# Patient Record
Sex: Female | Born: 1955 | Race: White | Hispanic: No | Marital: Married | Smoking: Never smoker
Health system: Southern US, Community
[De-identification: ages and names within clinical notes are randomized; demographics above are authoritative.]

## PROBLEM LIST (undated history)

## (undated) DIAGNOSIS — M1990 Unspecified osteoarthritis, unspecified site: Secondary | ICD-10-CM

## (undated) DIAGNOSIS — D8989 Other specified disorders involving the immune mechanism, not elsewhere classified: Secondary | ICD-10-CM

## (undated) DIAGNOSIS — M069 Rheumatoid arthritis, unspecified: Secondary | ICD-10-CM

## (undated) DIAGNOSIS — M351 Other overlap syndromes: Secondary | ICD-10-CM

## (undated) DIAGNOSIS — M810 Age-related osteoporosis without current pathological fracture: Secondary | ICD-10-CM

## (undated) DIAGNOSIS — M8580 Other specified disorders of bone density and structure, unspecified site: Secondary | ICD-10-CM

## (undated) HISTORY — DX: Rheumatoid arthritis, unspecified: M06.9

## (undated) HISTORY — DX: Other specified disorders involving the immune mechanism, not elsewhere classified: D89.89

## (undated) HISTORY — DX: Unspecified osteoarthritis, unspecified site: M19.90

## (undated) HISTORY — DX: Other specified disorders of bone density and structure, unspecified site: M85.80

## (undated) HISTORY — DX: Age-related osteoporosis without current pathological fracture: M81.0

## (undated) HISTORY — DX: Other overlap syndromes: M35.1

## (undated) HISTORY — PX: TUBAL LIGATION: SHX77

---

## 1997-04-19 ENCOUNTER — Other Ambulatory Visit: Admission: RE | Admit: 1997-04-19 | Discharge: 1997-04-19 | Payer: Self-pay | Admitting: *Deleted

## 1998-04-13 ENCOUNTER — Other Ambulatory Visit: Admission: RE | Admit: 1998-04-13 | Discharge: 1998-04-13 | Payer: Self-pay | Admitting: *Deleted

## 1999-05-27 ENCOUNTER — Other Ambulatory Visit: Admission: RE | Admit: 1999-05-27 | Discharge: 1999-05-27 | Payer: Self-pay | Admitting: *Deleted

## 1999-09-10 ENCOUNTER — Encounter: Admission: RE | Admit: 1999-09-10 | Discharge: 1999-09-10 | Payer: Self-pay | Admitting: Family Medicine

## 1999-09-10 ENCOUNTER — Encounter: Payer: Self-pay | Admitting: Family Medicine

## 2000-05-27 ENCOUNTER — Other Ambulatory Visit: Admission: RE | Admit: 2000-05-27 | Discharge: 2000-05-27 | Payer: Self-pay | Admitting: *Deleted

## 2001-04-29 ENCOUNTER — Other Ambulatory Visit: Admission: RE | Admit: 2001-04-29 | Discharge: 2001-04-29 | Payer: Self-pay | Admitting: *Deleted

## 2002-06-03 ENCOUNTER — Encounter: Payer: Self-pay | Admitting: Obstetrics and Gynecology

## 2002-06-03 ENCOUNTER — Ambulatory Visit (HOSPITAL_COMMUNITY): Admission: RE | Admit: 2002-06-03 | Discharge: 2002-06-03 | Payer: Self-pay | Admitting: Obstetrics and Gynecology

## 2002-06-03 ENCOUNTER — Other Ambulatory Visit: Admission: RE | Admit: 2002-06-03 | Discharge: 2002-06-03 | Payer: Self-pay | Admitting: Obstetrics and Gynecology

## 2003-06-27 ENCOUNTER — Ambulatory Visit (HOSPITAL_COMMUNITY): Admission: RE | Admit: 2003-06-27 | Discharge: 2003-06-27 | Payer: Self-pay | Admitting: Obstetrics and Gynecology

## 2003-06-27 ENCOUNTER — Other Ambulatory Visit: Admission: RE | Admit: 2003-06-27 | Discharge: 2003-06-27 | Payer: Self-pay | Admitting: Obstetrics and Gynecology

## 2004-06-28 ENCOUNTER — Ambulatory Visit (HOSPITAL_COMMUNITY): Admission: RE | Admit: 2004-06-28 | Discharge: 2004-06-28 | Payer: Self-pay | Admitting: Obstetrics and Gynecology

## 2004-06-28 ENCOUNTER — Other Ambulatory Visit: Admission: RE | Admit: 2004-06-28 | Discharge: 2004-06-28 | Payer: Self-pay | Admitting: Obstetrics and Gynecology

## 2004-11-18 ENCOUNTER — Ambulatory Visit: Payer: Self-pay | Admitting: Internal Medicine

## 2004-11-20 ENCOUNTER — Encounter: Admission: RE | Admit: 2004-11-20 | Discharge: 2004-11-20 | Payer: Self-pay | Admitting: Internal Medicine

## 2004-12-26 ENCOUNTER — Ambulatory Visit: Payer: Self-pay | Admitting: Internal Medicine

## 2005-01-10 ENCOUNTER — Ambulatory Visit: Payer: Self-pay | Admitting: Internal Medicine

## 2005-02-25 ENCOUNTER — Ambulatory Visit: Payer: Self-pay | Admitting: Internal Medicine

## 2005-03-04 ENCOUNTER — Ambulatory Visit: Payer: Self-pay | Admitting: Internal Medicine

## 2005-03-10 ENCOUNTER — Ambulatory Visit: Payer: Self-pay | Admitting: Gastroenterology

## 2005-04-17 ENCOUNTER — Ambulatory Visit: Payer: Self-pay | Admitting: Internal Medicine

## 2005-04-24 ENCOUNTER — Ambulatory Visit: Payer: Self-pay | Admitting: Cardiology

## 2005-07-01 ENCOUNTER — Ambulatory Visit (HOSPITAL_COMMUNITY): Admission: RE | Admit: 2005-07-01 | Discharge: 2005-07-01 | Payer: Self-pay | Admitting: Obstetrics and Gynecology

## 2005-07-17 ENCOUNTER — Encounter: Admission: RE | Admit: 2005-07-17 | Discharge: 2005-07-17 | Payer: Self-pay | Admitting: Obstetrics and Gynecology

## 2005-07-21 ENCOUNTER — Other Ambulatory Visit: Admission: RE | Admit: 2005-07-21 | Discharge: 2005-07-21 | Payer: Self-pay | Admitting: Obstetrics and Gynecology

## 2005-07-22 ENCOUNTER — Ambulatory Visit: Payer: Self-pay | Admitting: Internal Medicine

## 2006-07-03 ENCOUNTER — Ambulatory Visit (HOSPITAL_COMMUNITY): Admission: RE | Admit: 2006-07-03 | Discharge: 2006-07-03 | Payer: Self-pay | Admitting: Obstetrics and Gynecology

## 2006-11-13 ENCOUNTER — Other Ambulatory Visit: Admission: RE | Admit: 2006-11-13 | Discharge: 2006-11-13 | Payer: Self-pay | Admitting: Obstetrics and Gynecology

## 2007-10-07 ENCOUNTER — Encounter: Payer: Self-pay | Admitting: Internal Medicine

## 2007-11-26 ENCOUNTER — Ambulatory Visit (HOSPITAL_COMMUNITY): Admission: RE | Admit: 2007-11-26 | Discharge: 2007-11-26 | Payer: Self-pay | Admitting: Obstetrics and Gynecology

## 2007-11-29 ENCOUNTER — Other Ambulatory Visit: Admission: RE | Admit: 2007-11-29 | Discharge: 2007-11-29 | Payer: Self-pay | Admitting: Obstetrics and Gynecology

## 2008-02-22 ENCOUNTER — Encounter: Payer: Self-pay | Admitting: Internal Medicine

## 2008-12-22 ENCOUNTER — Ambulatory Visit (HOSPITAL_COMMUNITY): Admission: RE | Admit: 2008-12-22 | Discharge: 2008-12-22 | Payer: Self-pay | Admitting: Obstetrics and Gynecology

## 2009-06-21 ENCOUNTER — Ambulatory Visit (HOSPITAL_COMMUNITY): Admission: RE | Admit: 2009-06-21 | Discharge: 2009-06-21 | Payer: Self-pay | Admitting: Obstetrics and Gynecology

## 2010-01-27 ENCOUNTER — Encounter: Payer: Self-pay | Admitting: Obstetrics and Gynecology

## 2010-02-15 ENCOUNTER — Other Ambulatory Visit: Payer: Self-pay | Admitting: Obstetrics and Gynecology

## 2010-02-15 DIAGNOSIS — Z1231 Encounter for screening mammogram for malignant neoplasm of breast: Secondary | ICD-10-CM

## 2010-03-11 ENCOUNTER — Ambulatory Visit (HOSPITAL_COMMUNITY)
Admission: RE | Admit: 2010-03-11 | Discharge: 2010-03-11 | Disposition: A | Discharge status: Home / Self Care | Payer: BC Managed Care – PPO | Source: Ambulatory Visit | Attending: Obstetrics and Gynecology | Admitting: Obstetrics and Gynecology

## 2010-03-11 DIAGNOSIS — Z1231 Encounter for screening mammogram for malignant neoplasm of breast: Secondary | ICD-10-CM | POA: Insufficient documentation

## 2011-01-06 DIAGNOSIS — Z79899 Other long term (current) drug therapy: Secondary | ICD-10-CM | POA: Insufficient documentation

## 2011-01-06 DIAGNOSIS — M359 Systemic involvement of connective tissue, unspecified: Secondary | ICD-10-CM | POA: Insufficient documentation

## 2011-02-04 ENCOUNTER — Other Ambulatory Visit: Payer: Self-pay | Admitting: Obstetrics and Gynecology

## 2011-02-04 DIAGNOSIS — Z1231 Encounter for screening mammogram for malignant neoplasm of breast: Secondary | ICD-10-CM

## 2011-03-12 ENCOUNTER — Ambulatory Visit (HOSPITAL_COMMUNITY)
Admission: RE | Admit: 2011-03-12 | Discharge: 2011-03-12 | Disposition: A | Discharge status: Home / Self Care | Payer: BC Managed Care – PPO | Source: Ambulatory Visit | Attending: Obstetrics and Gynecology | Admitting: Obstetrics and Gynecology

## 2011-03-12 DIAGNOSIS — Z1231 Encounter for screening mammogram for malignant neoplasm of breast: Secondary | ICD-10-CM | POA: Insufficient documentation

## 2011-04-30 ENCOUNTER — Other Ambulatory Visit (HOSPITAL_COMMUNITY): Payer: Self-pay | Admitting: Internal Medicine

## 2011-04-30 DIAGNOSIS — M8580 Other specified disorders of bone density and structure, unspecified site: Secondary | ICD-10-CM

## 2011-07-14 ENCOUNTER — Ambulatory Visit (HOSPITAL_COMMUNITY): Payer: BC Managed Care – PPO

## 2011-07-17 ENCOUNTER — Ambulatory Visit (HOSPITAL_COMMUNITY)
Admission: RE | Admit: 2011-07-17 | Discharge: 2011-07-17 | Disposition: A | Discharge status: Home / Self Care | Payer: BC Managed Care – PPO | Source: Ambulatory Visit | Attending: Internal Medicine | Admitting: Internal Medicine

## 2011-07-17 DIAGNOSIS — Z780 Asymptomatic menopausal state: Secondary | ICD-10-CM | POA: Insufficient documentation

## 2011-07-17 DIAGNOSIS — M8580 Other specified disorders of bone density and structure, unspecified site: Secondary | ICD-10-CM

## 2011-07-17 DIAGNOSIS — Z13820 Encounter for screening for osteoporosis: Secondary | ICD-10-CM | POA: Insufficient documentation

## 2012-01-08 DIAGNOSIS — M159 Polyosteoarthritis, unspecified: Secondary | ICD-10-CM | POA: Insufficient documentation

## 2012-01-20 ENCOUNTER — Encounter: Payer: Self-pay | Admitting: Internal Medicine

## 2012-04-09 ENCOUNTER — Other Ambulatory Visit: Payer: Self-pay | Admitting: Obstetrics and Gynecology

## 2012-04-09 DIAGNOSIS — Z1231 Encounter for screening mammogram for malignant neoplasm of breast: Secondary | ICD-10-CM

## 2012-04-15 ENCOUNTER — Ambulatory Visit (HOSPITAL_COMMUNITY)
Admission: RE | Admit: 2012-04-15 | Discharge: 2012-04-15 | Disposition: A | Discharge status: Home / Self Care | Payer: BC Managed Care – PPO | Source: Ambulatory Visit | Attending: Obstetrics and Gynecology | Admitting: Obstetrics and Gynecology

## 2012-04-15 DIAGNOSIS — Z1231 Encounter for screening mammogram for malignant neoplasm of breast: Secondary | ICD-10-CM | POA: Insufficient documentation

## 2012-04-20 ENCOUNTER — Other Ambulatory Visit: Payer: Self-pay | Admitting: Obstetrics and Gynecology

## 2012-04-20 DIAGNOSIS — R928 Other abnormal and inconclusive findings on diagnostic imaging of breast: Secondary | ICD-10-CM

## 2012-05-03 ENCOUNTER — Ambulatory Visit
Admission: RE | Admit: 2012-05-03 | Discharge: 2012-05-03 | Disposition: A | Discharge status: Home / Self Care | Payer: BC Managed Care – PPO | Source: Ambulatory Visit | Attending: Obstetrics and Gynecology | Admitting: Obstetrics and Gynecology

## 2012-05-03 DIAGNOSIS — R928 Other abnormal and inconclusive findings on diagnostic imaging of breast: Secondary | ICD-10-CM

## 2012-09-01 ENCOUNTER — Encounter: Payer: Self-pay | Admitting: Internal Medicine

## 2012-09-28 ENCOUNTER — Other Ambulatory Visit: Payer: Self-pay | Admitting: Gynecology

## 2012-09-28 ENCOUNTER — Other Ambulatory Visit: Payer: Self-pay | Admitting: Certified Nurse Midwife

## 2012-09-28 DIAGNOSIS — N63 Unspecified lump in unspecified breast: Secondary | ICD-10-CM

## 2012-11-03 ENCOUNTER — Ambulatory Visit
Admission: RE | Admit: 2012-11-03 | Discharge: 2012-11-03 | Disposition: A | Discharge status: Home / Self Care | Payer: BC Managed Care – PPO | Source: Ambulatory Visit | Attending: Certified Nurse Midwife | Admitting: Certified Nurse Midwife

## 2012-11-03 ENCOUNTER — Other Ambulatory Visit: Payer: Self-pay | Admitting: Certified Nurse Midwife

## 2012-11-03 DIAGNOSIS — N63 Unspecified lump in unspecified breast: Secondary | ICD-10-CM

## 2012-12-09 ENCOUNTER — Telehealth: Payer: Self-pay | Admitting: *Deleted

## 2012-12-09 NOTE — Telephone Encounter (Signed)
Message copied by Alisa Graff on Thu Dec 09, 2012  9:44 AM ------      Message from: Jerene Bears      Created: Tue Nov 23, 2012 10:42 PM      Regarding: MMG recall       Pt should come out of current recall and needs new one 4/15.  Thanks.                  ----- Message -----         From: Verner Chol, CNM         Sent: 11/14/2012   5:32 PM           To: Annamaria Boots, MD            Determine status if OK for recall or need earlier follow up      Debbi       ------

## 2012-12-09 NOTE — Telephone Encounter (Signed)
Recall in entered for 04-20-13.

## 2013-01-20 ENCOUNTER — Encounter: Payer: Self-pay | Admitting: Certified Nurse Midwife

## 2013-02-16 ENCOUNTER — Encounter: Payer: Self-pay | Admitting: Certified Nurse Midwife

## 2013-03-01 ENCOUNTER — Ambulatory Visit: Payer: Self-pay | Admitting: Certified Nurse Midwife

## 2013-03-09 ENCOUNTER — Ambulatory Visit: Payer: Self-pay | Admitting: Certified Nurse Midwife

## 2013-03-18 ENCOUNTER — Encounter: Payer: Self-pay | Admitting: Certified Nurse Midwife

## 2013-03-22 ENCOUNTER — Ambulatory Visit: Payer: Self-pay | Admitting: Certified Nurse Midwife

## 2013-03-29 ENCOUNTER — Encounter: Payer: Self-pay | Admitting: Certified Nurse Midwife

## 2013-03-29 ENCOUNTER — Ambulatory Visit (INDEPENDENT_AMBULATORY_CARE_PROVIDER_SITE_OTHER): Payer: BC Managed Care – PPO | Admitting: Certified Nurse Midwife

## 2013-03-29 VITALS — BP 115/60 | HR 72 | Resp 16 | Ht 63.5 in | Wt 122.0 lb

## 2013-03-29 DIAGNOSIS — Z01419 Encounter for gynecological examination (general) (routine) without abnormal findings: Secondary | ICD-10-CM

## 2013-03-29 DIAGNOSIS — Z1211 Encounter for screening for malignant neoplasm of colon: Secondary | ICD-10-CM

## 2013-03-29 DIAGNOSIS — M8580 Other specified disorders of bone density and structure, unspecified site: Secondary | ICD-10-CM | POA: Insufficient documentation

## 2013-03-29 NOTE — Progress Notes (Signed)
58 y.o. G8P3003 Married Caucasian Fe here for annual exam. Menopausal no HRT, denies any vaginal bleeding. Using Replens OTC for vaginal dryness, working well. Continues with treatment for connective tissue disease, which is progressing. Sees Rheumatology for management. Sees PCP for aex, labs, and other medication management. No new health problems today.  No LMP recorded. Patient is postmenopausal.          Sexually active: yes  The current method of family planning is tubal ligation.    Exercising: yes  Walking Smoker:  no  Health Maintenance: Pap:  02/27/2012 Normal  MMG:  11/ 2014 and 04/2012 under follow up for left breast, scheduled 4/15 for mammogram and [place] Colonoscopy:  2007 - Every 10 years BMD:   07/2011 - In epic TDaP:  Will consult with PCP Labs: PCP   reports that she has never smoked. She has never used smokeless tobacco. She reports that she does not drink alcohol or use illicit drugs.  Past Medical History  Diagnosis Date  . Osteoporosis   . Autoimmune disorder     pleurisy  . Mixed connective tissue disease   . Osteoarthritis   . Osteopenia     Past Surgical History  Procedure Laterality Date  . Tubal ligation      1983    Current Outpatient Prescriptions  Medication Sig Dispense Refill  . aspirin 81 MG tablet Take 81 mg by mouth daily.      . Calcium Carbonate-Vitamin D 600-400 MG-UNIT per tablet Take 1 capsule by mouth.      . Cholecalciferol (VITAMIN D PO) Take 2,000 Int'l Units by mouth daily.       . folic acid (FOLVITE) 1 MG tablet Take 1 mg by mouth daily.       . hydroxychloroquine (PLAQUENIL) 200 MG tablet Take by mouth daily.      . methotrexate (RHEUMATREX) 2.5 MG tablet Take 5 tabs (12.5 mg total) BY MOUTH ONCE A WEEK. ALL TABS IN A SINGLE DOSE      . naproxen (NAPROSYN) 500 MG tablet Take 500 mg by mouth 2 (two) times daily with a meal.      . Probiotic Product (ALIGN) 4 MG CAPS Take by mouth.      . traMADol (ULTRAM) 50 MG tablet Take 100 mg  by mouth every 6 (six) hours as needed.      Jeanie Cooks Serrata (BOSWELLIA PO) Take by mouth.      Marland Kitchen CALCIUM PO Take by mouth daily.      . Cyanocobalamin (VITAMIN B 12 PO) Take by mouth daily.      Marland Kitchen HYDROcodone-acetaminophen (NORCO/VICODIN) 5-325 MG per tablet Take 1 tablet by mouth.      . Omega-3 Fatty Acids (FISH OIL PO) Take by mouth daily.       No current facility-administered medications for this visit.    Family History  Problem Relation Age of Onset  . Osteoporosis Mother   . Deep vein thrombosis Mother     ROS:  Pertinent items are noted in HPI.  Otherwise, a comprehensive ROS was negative.  Exam:   BP 115/60  Pulse 72  Resp 16  Ht 5' 3.5" (1.613 m)  Wt 122 lb (55.339 kg)  BMI 21.27 kg/m2 Height: 5' 3.5" (161.3 cm)  Ht Readings from Last 3 Encounters:  03/29/13 5' 3.5" (1.613 m)    General appearance: alert, cooperative and appears stated age Head: Normocephalic, without obvious abnormality, atraumatic Neck: no adenopathy, supple, symmetrical, trachea midline  and thyroid normal to inspection and palpation and non-palpable Lungs: clear to auscultation bilaterally Breasts: normal appearance, no masses or tenderness, No nipple retraction or dimpling, No nipple discharge or bleeding, No axillary or supraclavicular adenopathy, no palpable mass noted in area of follow up for left breast Heart: regular rate and rhythm Abdomen: soft, non-tender; no masses,  no organomegaly Extremities: extremities normal, atraumatic, no cyanosis or edema Skin: Skin color, texture, turgor normal. No rashes or lesions Lymph nodes: Cervical, supraclavicular, and axillary nodes normal. No abnormal inguinal nodes palpated Neurologic: Grossly normal   Pelvic: External genitalia:  no lesions              Urethra:  normal appearing urethra with no masses, tenderness or lesions              Bartholin's and Skene's: normal                 Vagina: normal appearing vagina with normal color and  discharge, no lesions              Cervix: normal, non tender              Pap taken: no Bimanual Exam:  Uterus:  normal size, contour, position, consistency, mobility, non-tender and anteverted              Adnexa: normal adnexa and no mass, fullness, tenderness               Rectovaginal: Confirms               Anus:  normal sphincter tone, no lesions  A:  Well Woman with normal exam  Menopausal no HRT  Connective tissue disease under Rheumatology management  Left breast mass under follow up, appeared benign findings  P:   Reviewed health and wellness pertinent to exam  Aware of importance to advise if vaginal bleeding  Continue follow up as indicated  Pap smear as per guidelines   Mammogram per follow up, schedule 4/15 pap smear not taken today  counseled on breast self exam, mammography screening, adequate intake of calcium and vitamin D, diet and exercise Colonoscopy in 2 years, IFOB dispensed with instructions.  return annually or prn  An After Visit Summary was printed and given to the patient.

## 2013-03-29 NOTE — Patient Instructions (Signed)

## 2013-03-31 ENCOUNTER — Other Ambulatory Visit: Payer: Self-pay | Admitting: Certified Nurse Midwife

## 2013-03-31 DIAGNOSIS — N63 Unspecified lump in unspecified breast: Secondary | ICD-10-CM

## 2013-03-31 NOTE — Progress Notes (Signed)
Reviewed personally.  M. Suzanne Keiley Levey, MD.  

## 2013-05-04 ENCOUNTER — Ambulatory Visit: Payer: BC Managed Care – PPO

## 2013-05-04 ENCOUNTER — Other Ambulatory Visit: Payer: BC Managed Care – PPO

## 2013-05-17 ENCOUNTER — Ambulatory Visit
Admission: RE | Admit: 2013-05-17 | Discharge: 2013-05-17 | Disposition: A | Discharge status: Home / Self Care | Payer: BC Managed Care – PPO | Source: Ambulatory Visit | Attending: Certified Nurse Midwife | Admitting: Certified Nurse Midwife

## 2013-05-17 DIAGNOSIS — N63 Unspecified lump in unspecified breast: Secondary | ICD-10-CM

## 2013-08-15 ENCOUNTER — Telehealth: Payer: Self-pay

## 2013-08-15 NOTE — Telephone Encounter (Signed)
Message copied by Eliezer Bottom on Mon Aug 15, 2013  2:22 PM ------      Message from: Verner Chol      Created: Sat Aug 13, 2013  4:37 PM       Has not completed IFOB please call ------

## 2013-08-15 NOTE — Telephone Encounter (Signed)
Called & left detailed message. DPR signed

## 2013-08-29 DIAGNOSIS — F329 Major depressive disorder, single episode, unspecified: Secondary | ICD-10-CM | POA: Insufficient documentation

## 2013-08-29 DIAGNOSIS — F32A Depression, unspecified: Secondary | ICD-10-CM | POA: Insufficient documentation

## 2013-08-29 DIAGNOSIS — E673 Hypervitaminosis D: Secondary | ICD-10-CM | POA: Insufficient documentation

## 2013-08-31 LAB — FECAL OCCULT BLOOD, IMMUNOCHEMICAL: IFOBT: NEGATIVE

## 2013-11-07 ENCOUNTER — Encounter: Payer: Self-pay | Admitting: Certified Nurse Midwife

## 2014-04-05 ENCOUNTER — Ambulatory Visit: Payer: Self-pay | Admitting: Certified Nurse Midwife

## 2014-04-10 ENCOUNTER — Other Ambulatory Visit: Payer: Self-pay | Admitting: Certified Nurse Midwife

## 2014-04-10 ENCOUNTER — Other Ambulatory Visit: Payer: Self-pay | Admitting: Internal Medicine

## 2014-04-10 DIAGNOSIS — N63 Unspecified lump in unspecified breast: Secondary | ICD-10-CM

## 2014-04-13 ENCOUNTER — Ambulatory Visit: Payer: Self-pay | Admitting: Certified Nurse Midwife

## 2014-05-22 ENCOUNTER — Ambulatory Visit
Admission: RE | Admit: 2014-05-22 | Discharge: 2014-05-22 | Disposition: A | Discharge status: Home / Self Care | Payer: BLUE CROSS/BLUE SHIELD | Source: Ambulatory Visit | Attending: Certified Nurse Midwife | Admitting: Certified Nurse Midwife

## 2014-05-22 DIAGNOSIS — N63 Unspecified lump in unspecified breast: Secondary | ICD-10-CM

## 2014-08-01 ENCOUNTER — Encounter: Payer: Self-pay | Admitting: Certified Nurse Midwife

## 2014-08-01 ENCOUNTER — Ambulatory Visit (INDEPENDENT_AMBULATORY_CARE_PROVIDER_SITE_OTHER): Payer: BLUE CROSS/BLUE SHIELD | Admitting: Certified Nurse Midwife

## 2014-08-01 VITALS — BP 94/60 | HR 70 | Resp 16 | Ht 63.25 in | Wt 116.0 lb

## 2014-08-01 DIAGNOSIS — Z01419 Encounter for gynecological examination (general) (routine) without abnormal findings: Secondary | ICD-10-CM

## 2014-08-01 DIAGNOSIS — Z124 Encounter for screening for malignant neoplasm of cervix: Secondary | ICD-10-CM | POA: Diagnosis not present

## 2014-08-01 NOTE — Patient Instructions (Addendum)
Atrophic Vaginitis Atrophic vaginitis is a problem of low levels of estrogen in women. This problem can happen at any age. It is most common in women who have gone through menopause ("the change").  HOW WILL I KNOW IF I HAVE THIS PROBLEM? You may have:  Trouble with peeing (urinating), such as:  Going to the bathroom often.  A hard time holding your pee until you reach a bathroom.  Leaking pee.  Having pain when you pee.  Itching or a burning feeling.  Vaginal bleeding and spotting.  Pain during sex.  Dryness of the vagina.  A yellow, bad-smelling fluid (discharge) coming from the vagina. HOW WILL MY DOCTOR CHECK FOR THIS PROBLEM?  During your exam, your doctor will likely find the problem.  If there is a vaginal fluid, it may be checked for infection. HOW WILL THIS PROBLEM BE TREATED? Keep the vulvar skin as clean as possible. Moisturizers and lubricants can help with some of the symptoms. Estrogen replacement can help. There are 2 ways to take estrogen:  Systemic estrogen gets estrogen to your whole body. It takes many weeks or months before the symptoms get better.  You take an estrogen pill.  You use a skin patch. This is a patch that you put on your skin.  If you still have your uterus, your doctor may ask you to take a hormone. Talk to your doctor about the right medicine for you.  Estrogen cream.  This puts estrogen only at the part of your body where you apply it. The cream is put into the vagina or put on the vulvar skin. For some women, estrogen cream works faster than pills or the patch. CAN ALL WOMEN WITH THIS PROBLEM USE ESTROGEN? No. Women with certain types of cancer, liver problems, or problems with blood clots should not take estrogen. Your doctor can help you decide the best treatment for your symptoms. Document Released: 06/11/2007 Document Revised: 12/28/2012 Document Reviewed: 06/11/2007 [employer] Patient Information 2015 [employer]. This  information is not intended to replace advice given to you by your health care provider. Make sure you discuss any questions you have with your health care provider.  EXERCISE AND DIET:  We recommended that you start or continue a regular exercise program for good health. Regular exercise means any activity that makes your heart beat faster and makes you sweat.  We recommend exercising at least 30 minutes per day at least 3 days a week, preferably 4 or 5.  We also recommend a diet low in fat and sugar.  Inactivity, poor dietary choices and obesity can cause diabetes, heart attack, stroke, and kidney damage, among others.    ALCOHOL AND SMOKING:  Women should limit their alcohol intake to no more than 7 drinks/beers/glasses of wine (combined, not each!) per week. Moderation of alcohol intake to this level decreases your risk of breast cancer and liver damage. And of course, no recreational drugs are part of a healthy lifestyle.  And absolutely no smoking or even second hand smoke. Most people know smoking can cause heart and lung diseases, but did you know it also contributes to weakening of your bones? Aging of your skin?  Yellowing of your teeth and nails?  CALCIUM AND VITAMIN D:  Adequate intake of calcium and Vitamin D are recommended.  The recommendations for exact amounts of these supplements seem to change often, but generally speaking 600 mg of calcium (either carbonate or citrate) and 800 units of Vitamin D per day seems   prudent. Certain women may benefit from higher intake of Vitamin D.  If you are among these women, your doctor will have told you during your visit.    PAP SMEARS:  Pap smears, to check for cervical cancer or precancers,  have traditionally been done yearly, although recent scientific advances have shown that most women can have pap smears less often.  However, every woman still should have a physical exam from her gynecologist every year. It will include a breast check, inspection  of the vulva and vagina to check for abnormal growths or skin changes, a visual exam of the cervix, and then an exam to evaluate the size and shape of the uterus and ovaries.  And after 61 years of age, a rectal exam is indicated to check for rectal cancers. We will also provide age appropriate advice regarding health maintenance, like when you should have certain vaccines, screening for sexually transmitted diseases, bone density testing, colonoscopy, mammograms, etc.   MAMMOGRAMS:  All women over 40 years old should have a yearly mammogram. Many facilities now offer a "3D" mammogram, which may cost around $50 extra out of pocket. If possible,  we recommend you accept the option to have the 3D mammogram performed.  It both reduces the number of women who will be called back for extra views which then turn out to be normal, and it is better than the routine mammogram at detecting truly abnormal areas.    COLONOSCOPY:  Colonoscopy to screen for colon cancer is recommended for all women at age 50.  We know, you hate the idea of the prep.  We agree, BUT, having colon cancer and not knowing it is worse!!  Colon cancer so often starts as a polyp that can be seen and removed at colonscopy, which can quite literally save your life!  And if your first colonoscopy is normal and you have no family history of colon cancer, most women don't have to have it again for 10 years.  Once every ten years, you can do something that may end up saving your life, right?  We will be happy to help you get it scheduled when you are ready.  Be sure to check your insurance coverage so you understand how much it will cost.  It may be covered as a preventative service at no cost, but you should check your particular policy.       

## 2014-08-01 NOTE — Progress Notes (Signed)
59 y.o. G32P3003 Married  Caucasian Fe here for annual exam. Menopausal no vaginal bleeding. Using Replens for vaginal dryness, but not working well. Sees Dr. Sherlyn Lick with aex/labs. Last BMD showed Osteoporosis now on Forteo with specialist in [place] Connective tissue disease no change. Sees Rheumatology every 6 months. Continues to have constipation but plans to start on probiotics to help with and discuss with MD. No other health issues today.  No LMP recorded. Patient is postmenopausal.          Sexually active: Yes.    The current method of family planning is tubal ligation.    Exercising: Yes.    walking Smoker:  no  Health Maintenance: Pap:  02-27-12 neg MMG: 05-22-14 u/s left breast density c birads 2 3 D yearly Colonoscopy: 2007 f/u 37yrs  BMD: 4/16  Osteoporosis sees Dr.Lake (osteoporosis specialist) now Forteo TDaP:  Will check with pcp Labs: none Self breast exam: done monthly   reports that she has never smoked. She has never used smokeless tobacco. She reports that she does not drink alcohol or use illicit drugs.  Past Medical History  Diagnosis Date  . Osteoporosis   . Autoimmune disorder     pleurisy  . Mixed connective tissue disease   . Osteoarthritis   . Osteopenia   . Osteoporosis     Past Surgical History  Procedure Laterality Date  . Tubal ligation      1983    Current Outpatient Prescriptions  Medication Sig Dispense Refill  . aspirin 81 MG tablet Take 81 mg by mouth daily.    . Calcium Citrate (CITRACAL PO) Take by mouth.    . Cholecalciferol (VITAMIN D PO) Take 2,000 Int'l Units by mouth daily.     . Cyanocobalamin (VITAMIN B 12 PO) Take by mouth daily.    Marland Kitchen escitalopram (LEXAPRO) 10 MG tablet Take 10 mg by mouth daily.    . folic acid (FOLVITE) 1 MG tablet Take 1 mg by mouth daily.     . hydroxychloroquine (PLAQUENIL) 200 MG tablet Take by mouth daily.    Marland Kitchen LORazepam (ATIVAN) 1 MG tablet Take 1 mg by mouth.    . methotrexate (RHEUMATREX) 2.5 MG tablet  Take 5 tabs (12.5 mg total) BY MOUTH ONCE A WEEK. ALL TABS IN A SINGLE DOSE    . naproxen (NAPROSYN) 500 MG tablet Take 500 mg by mouth 2 (two) times daily with a meal.    . Teriparatide, Recombinant, (FORTEO) 600 MCG/2.4ML SOLN Inject 20 mcg into the skin.    Marland Kitchen traMADol (ULTRAM) 50 MG tablet Take 100 mg by mouth every 6 (six) hours as needed.    Marland Kitchen UNABLE TO FIND 300 mg. Avocado po Takes 2 daily     No current facility-administered medications for this visit.    Family History  Problem Relation Age of Onset  . Osteoporosis Mother   . Deep vein thrombosis Mother     ROS:  Pertinent items are noted in HPI.  Otherwise, a comprehensive ROS was negative.  Exam:   BP 94/60 mmHg  Pulse 70  Resp 16  Ht 5' 3.25" (1.607 m)  Wt 116 lb (52.617 kg)  BMI 20.37 kg/m2 Height: 5' 3.25" (160.7 cm) Ht Readings from Last 3 Encounters:  08/01/14 5' 3.25" (1.607 m)  03/29/13 5' 3.5" (1.613 m)    General appearance: alert, cooperative and appears stated age Head: Normocephalic, without obvious abnormality, atraumatic Neck: no adenopathy, supple, symmetrical, trachea midline and thyroid normal to inspection  and palpation Lungs: clear to auscultation bilaterally Breasts: normal appearance, no masses or tenderness, No nipple retraction or dimpling, No nipple discharge or bleeding, No axillary or supraclavicular adenopathy Heart: regular rate and rhythm Abdomen: soft, non-tender; no masses,  no organomegaly Extremities: extremities normal, atraumatic, no cyanosis or edema Skin: Skin color, texture, turgor normal. No rashes or lesions Lymph nodes: Cervical, supraclavicular, and axillary nodes normal. No abnormal inguinal nodes palpated Neurologic: Grossly normal   Pelvic: External genitalia:  no lesions              Urethra:  normal appearing urethra with no masses, tenderness or lesions              Bartholin's and Skene's: normal                 Vagina: atrophic appearing vagina with normal  color and discharge, no lesions              Cervix: normal,no lesions              Pap taken: Yes.   Bimanual Exam:  Uterus:  normal size, contour, position, consistency, mobility, non-tender              Adnexa: normal adnexa and no mass, fullness, tenderness               Rectovaginal: Confirms               Anus:  normal sphincter tone, no lesions  Chaperone present: Yes  A:  Well Woman with normal exam  Menopausal no HRT  Atrophic Vaginitis   Connective tissue disease with MD management P:   Reviewed health and wellness pertinent to exam  Aware of need to evaluate if vaginal bleeding  Discussed coconut oil use daily to see if this improves, will advise  Pap smear taken today with HPVHR  Continue MD follow up as indicated   counseled on breast self exam, mammography screening, adequate intake of calcium and vitamin D, diet and exercise  return annually or prn  An After Visit Summary was printed and given to the patient.

## 2014-08-03 LAB — IPS PAP TEST WITH HPV

## 2014-08-03 NOTE — Progress Notes (Signed)
Reviewed personally.  M. Suzanne Becky Berberian, MD.  

## 2014-12-30 DIAGNOSIS — M810 Age-related osteoporosis without current pathological fracture: Secondary | ICD-10-CM | POA: Insufficient documentation

## 2015-05-01 ENCOUNTER — Other Ambulatory Visit: Payer: Self-pay

## 2015-05-01 DIAGNOSIS — Z1231 Encounter for screening mammogram for malignant neoplasm of breast: Secondary | ICD-10-CM

## 2015-05-23 ENCOUNTER — Ambulatory Visit
Admission: RE | Admit: 2015-05-23 | Discharge: 2015-05-23 | Disposition: A | Discharge status: Home / Self Care | Payer: BLUE CROSS/BLUE SHIELD | Source: Ambulatory Visit

## 2015-05-23 DIAGNOSIS — Z1231 Encounter for screening mammogram for malignant neoplasm of breast: Secondary | ICD-10-CM

## 2015-05-30 DIAGNOSIS — F5101 Primary insomnia: Secondary | ICD-10-CM | POA: Insufficient documentation

## 2015-08-03 ENCOUNTER — Ambulatory Visit: Payer: BLUE CROSS/BLUE SHIELD | Admitting: Certified Nurse Midwife

## 2015-11-13 ENCOUNTER — Other Ambulatory Visit: Payer: Self-pay | Admitting: Internal Medicine

## 2015-11-13 DIAGNOSIS — N6310 Unspecified lump in the right breast, unspecified quadrant: Secondary | ICD-10-CM

## 2015-11-16 ENCOUNTER — Ambulatory Visit
Admission: RE | Admit: 2015-11-16 | Discharge: 2015-11-16 | Disposition: A | Discharge status: Home / Self Care | Payer: BLUE CROSS/BLUE SHIELD | Source: Ambulatory Visit | Attending: Internal Medicine | Admitting: Internal Medicine

## 2015-11-16 DIAGNOSIS — N6310 Unspecified lump in the right breast, unspecified quadrant: Secondary | ICD-10-CM

## 2016-07-24 DIAGNOSIS — Z79899 Other long term (current) drug therapy: Secondary | ICD-10-CM | POA: Insufficient documentation

## 2016-07-24 DIAGNOSIS — M1990 Unspecified osteoarthritis, unspecified site: Secondary | ICD-10-CM | POA: Insufficient documentation

## 2016-07-24 DIAGNOSIS — Z7960 Long term (current) use of unspecified immunomodulators and immunosuppressants: Secondary | ICD-10-CM | POA: Insufficient documentation

## 2016-07-24 DIAGNOSIS — M170 Bilateral primary osteoarthritis of knee: Secondary | ICD-10-CM | POA: Insufficient documentation

## 2016-10-15 ENCOUNTER — Other Ambulatory Visit: Payer: Self-pay | Admitting: Internal Medicine

## 2016-10-15 DIAGNOSIS — Z1231 Encounter for screening mammogram for malignant neoplasm of breast: Secondary | ICD-10-CM

## 2016-10-30 ENCOUNTER — Ambulatory Visit
Admission: RE | Admit: 2016-10-30 | Discharge: 2016-10-30 | Disposition: A | Discharge status: Home / Self Care | Payer: BLUE CROSS/BLUE SHIELD | Source: Ambulatory Visit | Attending: Internal Medicine | Admitting: Internal Medicine

## 2016-10-30 DIAGNOSIS — Z1231 Encounter for screening mammogram for malignant neoplasm of breast: Secondary | ICD-10-CM

## 2016-11-14 ENCOUNTER — Emergency Department (HOSPITAL_BASED_OUTPATIENT_CLINIC_OR_DEPARTMENT_OTHER): Payer: BLUE CROSS/BLUE SHIELD

## 2016-11-14 ENCOUNTER — Encounter (HOSPITAL_BASED_OUTPATIENT_CLINIC_OR_DEPARTMENT_OTHER): Payer: Self-pay | Admitting: *Deleted

## 2016-11-14 ENCOUNTER — Observation Stay (HOSPITAL_BASED_OUTPATIENT_CLINIC_OR_DEPARTMENT_OTHER)
Admission: EM | Admit: 2016-11-14 | Discharge: 2016-11-15 | Disposition: A | Discharge status: Home / Self Care | Payer: BLUE CROSS/BLUE SHIELD | Attending: Internal Medicine | Admitting: Internal Medicine

## 2016-11-14 DIAGNOSIS — D8989 Other specified disorders involving the immune mechanism, not elsewhere classified: Secondary | ICD-10-CM | POA: Insufficient documentation

## 2016-11-14 DIAGNOSIS — M069 Rheumatoid arthritis, unspecified: Secondary | ICD-10-CM | POA: Insufficient documentation

## 2016-11-14 DIAGNOSIS — M359 Systemic involvement of connective tissue, unspecified: Secondary | ICD-10-CM | POA: Diagnosis present

## 2016-11-14 DIAGNOSIS — K1120 Sialoadenitis, unspecified: Secondary | ICD-10-CM

## 2016-11-14 DIAGNOSIS — Z79899 Other long term (current) drug therapy: Secondary | ICD-10-CM | POA: Diagnosis not present

## 2016-11-14 DIAGNOSIS — R221 Localized swelling, mass and lump, neck: Secondary | ICD-10-CM

## 2016-11-14 DIAGNOSIS — M351 Other overlap syndromes: Secondary | ICD-10-CM | POA: Diagnosis not present

## 2016-11-14 LAB — CBC WITH DIFFERENTIAL/PLATELET
BASOS ABS: 0 10*3/uL (ref 0.0–0.1)
BASOS PCT: 0 %
EOS ABS: 0 10*3/uL (ref 0.0–0.7)
EOS PCT: 0 %
HCT: 35.1 % — ABNORMAL LOW (ref 36.0–46.0)
Hemoglobin: 11.5 g/dL — ABNORMAL LOW (ref 12.0–15.0)
Lymphocytes Relative: 24 %
Lymphs Abs: 3.6 10*3/uL (ref 0.7–4.0)
MCH: 30.2 pg (ref 26.0–34.0)
MCHC: 32.8 g/dL (ref 30.0–36.0)
MCV: 92.1 fL (ref 78.0–100.0)
MONO ABS: 1.8 10*3/uL — AB (ref 0.1–1.0)
Monocytes Relative: 12 %
Neutro Abs: 9.4 10*3/uL — ABNORMAL HIGH (ref 1.7–7.7)
Neutrophils Relative %: 64 %
Platelets: 350 10*3/uL (ref 150–400)
RBC: 3.81 MIL/uL — ABNORMAL LOW (ref 3.87–5.11)
RDW: 16.3 % — AB (ref 11.5–15.5)
WBC: 14.8 10*3/uL — ABNORMAL HIGH (ref 4.0–10.5)

## 2016-11-14 LAB — BASIC METABOLIC PANEL
Anion gap: 8 (ref 5–15)
BUN: 23 mg/dL — ABNORMAL HIGH (ref 6–20)
CALCIUM: 9.1 mg/dL (ref 8.9–10.3)
CO2: 25 mmol/L (ref 22–32)
CREATININE: 0.79 mg/dL (ref 0.44–1.00)
Chloride: 107 mmol/L (ref 101–111)
GFR calc Af Amer: >60 mL/min (ref >60)
GFR calc non Af Amer: >60 mL/min (ref >60)
GLUCOSE: 121 mg/dL — AB (ref 65–99)
Potassium: 3.2 mmol/L — ABNORMAL LOW (ref 3.5–5.1)
Sodium: 140 mmol/L (ref 135–145)

## 2016-11-14 LAB — C-REACTIVE PROTEIN: CRP: 1.7 mg/dL — ABNORMAL HIGH (ref <1.0)

## 2016-11-14 LAB — SEDIMENTATION RATE: Sed Rate: 7 mm/hr (ref 0–22)

## 2016-11-14 MED ORDER — METHYLPREDNISOLONE SODIUM SUCC 125 MG IJ SOLR
125.0000 mg | Freq: Once | INTRAMUSCULAR | Status: AC
  Administered 2016-11-14: 125 mg via INTRAVENOUS
  Filled 2016-11-14: qty 2

## 2016-11-14 MED ORDER — FAMOTIDINE IN NACL 20-0.9 MG/50ML-% IV SOLN
20.0000 mg | Freq: Once | INTRAVENOUS | Status: AC
  Administered 2016-11-14: 20 mg via INTRAVENOUS
  Filled 2016-11-14: qty 50

## 2016-11-14 MED ORDER — SODIUM CHLORIDE 0.9 % IV SOLN
INTRAVENOUS | Status: DC
  Administered 2016-11-14 – 2016-11-15 (×3): via INTRAVENOUS

## 2016-11-14 MED ORDER — DIPHENHYDRAMINE HCL 25 MG PO CAPS
25.0000 mg | ORAL_CAPSULE | Freq: Two times a day (BID) | ORAL | Status: DC
  Administered 2016-11-14: 25 mg via ORAL
  Filled 2016-11-14 (×2): qty 1

## 2016-11-14 MED ORDER — KETOROLAC TROMETHAMINE 15 MG/ML IJ SOLN: 15.0000 mg | Freq: Four times a day (QID) | INTRAMUSCULAR | Status: DC | PRN

## 2016-11-14 MED ORDER — ONDANSETRON HCL 4 MG PO TABS: 4.0000 mg | ORAL_TABLET | Freq: Four times a day (QID) | ORAL | Status: DC | PRN

## 2016-11-14 MED ORDER — TRAMADOL HCL 50 MG PO TABS: 100.0000 mg | ORAL_TABLET | Freq: Four times a day (QID) | ORAL | Status: DC | PRN

## 2016-11-14 MED ORDER — FAMOTIDINE 20 MG PO TABS
20.0000 mg | ORAL_TABLET | Freq: Two times a day (BID) | ORAL | Status: DC
  Administered 2016-11-15: 20 mg via ORAL
  Filled 2016-11-14: qty 1

## 2016-11-14 MED ORDER — ENOXAPARIN SODIUM 40 MG/0.4ML ~~LOC~~ SOLN
40.0000 mg | SUBCUTANEOUS | Status: DC
  Administered 2016-11-14: 40 mg via SUBCUTANEOUS
  Filled 2016-11-14: qty 0.4

## 2016-11-14 MED ORDER — METHYLPREDNISOLONE SODIUM SUCC 125 MG IJ SOLR
60.0000 mg | Freq: Three times a day (TID) | INTRAMUSCULAR | Status: DC
  Administered 2016-11-14 – 2016-11-15 (×2): 60 mg via INTRAVENOUS
  Filled 2016-11-14 (×2): qty 2

## 2016-11-14 MED ORDER — DIPHENHYDRAMINE HCL 50 MG/ML IJ SOLN
50.0000 mg | Freq: Once | INTRAMUSCULAR | Status: AC
  Administered 2016-11-14: 50 mg via INTRAVENOUS
  Filled 2016-11-14: qty 1

## 2016-11-14 MED ORDER — ACETAMINOPHEN 650 MG RE SUPP: 650.0000 mg | Freq: Four times a day (QID) | RECTAL | Status: DC | PRN

## 2016-11-14 MED ORDER — LORAZEPAM 1 MG PO TABS
1.0000 mg | ORAL_TABLET | Freq: Every day | ORAL | Status: DC
  Administered 2016-11-14: 0.5 mg via ORAL
  Filled 2016-11-14: qty 1

## 2016-11-14 MED ORDER — HYDROXYCHLOROQUINE SULFATE 200 MG PO TABS
200.0000 mg | ORAL_TABLET | Freq: Every day | ORAL | Status: DC
  Administered 2016-11-14 – 2016-11-15 (×2): 200 mg via ORAL
  Filled 2016-11-14 (×2): qty 1

## 2016-11-14 MED ORDER — ESCITALOPRAM OXALATE 10 MG PO TABS
10.0000 mg | ORAL_TABLET | Freq: Every day | ORAL | Status: DC
  Administered 2016-11-14 – 2016-11-15 (×2): 10 mg via ORAL
  Filled 2016-11-14 (×2): qty 1

## 2016-11-14 MED ORDER — EPINEPHRINE 0.3 MG/0.3ML IJ SOAJ
0.3000 mg | Freq: Once | INTRAMUSCULAR | Status: AC
  Administered 2016-11-14: 0.3 mg via INTRAMUSCULAR

## 2016-11-14 MED ORDER — ACETAMINOPHEN 325 MG PO TABS: 650.0000 mg | ORAL_TABLET | Freq: Four times a day (QID) | ORAL | Status: DC | PRN

## 2016-11-14 MED ORDER — ONDANSETRON HCL 4 MG/2ML IJ SOLN: 4.0000 mg | Freq: Four times a day (QID) | INTRAMUSCULAR | Status: DC | PRN

## 2016-11-14 MED ORDER — PANTOPRAZOLE SODIUM 40 MG IV SOLR
40.0000 mg | Freq: Two times a day (BID) | INTRAVENOUS | Status: DC
  Administered 2016-11-14: 40 mg via INTRAVENOUS
  Filled 2016-11-14: qty 40

## 2016-11-14 MED ORDER — METHYLPREDNISOLONE SODIUM SUCC 125 MG IJ SOLR
60.0000 mg | Freq: Four times a day (QID) | INTRAMUSCULAR | Status: DC
  Administered 2016-11-14 (×2): 60 mg via INTRAVENOUS
  Filled 2016-11-14 (×2): qty 2

## 2016-11-14 MED ORDER — LORAZEPAM 2 MG/ML IJ SOLN: 0.5000 mg | Freq: Every evening | INTRAMUSCULAR | Status: DC | PRN

## 2016-11-14 MED ORDER — FOLIC ACID 1 MG PO TABS
1.0000 mg | ORAL_TABLET | Freq: Every day | ORAL | Status: DC
  Administered 2016-11-14 – 2016-11-15 (×2): 1 mg via ORAL
  Filled 2016-11-14 (×2): qty 1

## 2016-11-14 MED ORDER — IOPAMIDOL (ISOVUE-300) INJECTION 61%
100.0000 mL | Freq: Once | INTRAVENOUS | Status: AC | PRN
  Administered 2016-11-14: 75 mL via INTRAVENOUS

## 2016-11-14 MED ORDER — ASPIRIN EC 81 MG PO TBEC
81.0000 mg | DELAYED_RELEASE_TABLET | Freq: Every day | ORAL | Status: DC
  Administered 2016-11-14 – 2016-11-15 (×2): 81 mg via ORAL
  Filled 2016-11-14 (×2): qty 1

## 2016-11-14 MED ORDER — EPINEPHRINE 0.3 MG/0.3ML IJ SOAJ
INTRAMUSCULAR | Status: AC
  Filled 2016-11-14: qty 0.3

## 2016-11-14 NOTE — H&P (Signed)
History and Physical    Patricia Oconnell RAQ:[id number] DOB: 1955/05/25 DOA: 11/14/2016   PCP: Barbie Banner, MD   Attending physician: Denton Brick  Patient coming from/Resides with: Private residence/husband  Chief Complaint: Neck, jaw, facial swelling  HPI: Patricia Oconnell is a 60 y.o. female with medical history significant for connective tissue disorder followed at [facility] rheumatology on Humira, methotrexate, Plaquenil and anti-inflammatories.  Patient reports that about 48 hours prior she began having pain in her left shoulder which is typical for a mild exacerbation of her known connective tissue disorder.  This pain began to radiate to her neck and jaw.  She took her usual medications without relief.  She was unable to sleep.  The following morning she went to an urgent care and was given an IM steroid as well as prednisone Dosepak.  She also underwent an ultrasound head neck soft tissue which demonstrated no sonographic abnormality.  Her symptoms seem to improve but returned yesterday evening and were associated with a sore throat, and new lip swelling.  She was evaluated at [facility] where a CT soft tissue neck with contrast was completed.  This revealed abnormalities of the submandibular glands without sialolithiasis or ductal obstruction.  There was a significant amount of edema in the submandibular space and the soft tissues of the submental neck with a large retropharyngeal effusion that was felt to be reactive in nature and was not consistent with an abscess.  There were concerns of her autoimmune sialadenitis or a possible viral etiology.  EDP consulted ENT by telephone who recommended admission and continued use of steroids with plans to evaluate the patient after they arrived to [facility].  Of note the patient has been able to manage oral secretions, she has had no stridor or other airway issues or hypoxemia.  She is currently complaining of a mild sore  throat.  ED Course:  Vital Signs: BP (!) 127/54 (BP Location: Left Arm)   Pulse 74   Temp 98 F (36.7 C) (Oral)   Resp 11   Ht _0  (1.6 m)   Wt 48.8 kg (107 lb 9.6 oz)   SpO2 100%   BMI 19.06 kg/m  CT soft tissue neck with contrast: As above Lab data: Sodium 140, potassium 3.2, chloride 107, CO2 25, glucose 121, BUN 23, creatinine 0.79, white count 14,800 with neutrophils 64%, and absolute neutrophils 9.4%, hemoglobin 11.5, platelets 350,000 Medications and treatments: Epinephrine 0.3 mg IM x1, Solu-Medrol 125 mg IV x1, Pepcid 20 mg IV x1, Benadryl 50 mg IV x1, Solu-Medrol 125 mg IV repeated 7 hours after initial dose  Review of Systems:  In addition to the HPI above,  No Fever-chills, myalgias or other constitutional symptoms No Headache, changes with Vision or hearing, new weakness, tingling, numbness in any extremity, dizziness, dysarthria or word finding difficulty, gait disturbance or imbalance, tremors or seizure activity No problems indigestion/reflux, choking or coughing while eating, abdominal pain with or after eating No Chest pain, Cough or Shortness of Breath, palpitations, orthopnea or DOE No Abdominal pain, N/V, melena,hematochezia, dark tarry stools, constipation No dysuria, malodorous urine, hematuria or flank pain No new skin rashes, lesions, masses or bruises, No new joint pains, aches, swelling or redness No recent unintentional weight gain or loss No polyuria, polydypsia or polyphagia   Past Medical History:  Diagnosis Date  . Autoimmune disorder (HCC)    pleurisy  . Mixed connective tissue disease ([place])   .  Osteoarthritis   . Osteopenia   . Osteoporosis   . Osteoporosis     Past Surgical History:  Procedure Laterality Date  . TUBAL LIGATION     1983    Social History   Socioeconomic History  . Marital status: Married    Spouse name: Not on file  . Number of children: Not on file  . Years of education: Not on file  . Highest education  level: Not on file  Social Needs  . Financial resource strain: Not on file  . Food insecurity - worry: Not on file  . Food insecurity - inability: Not on file  . Transportation needs - medical: Not on file  . Transportation needs - non-medical: Not on file  Occupational History  . Not on file  Tobacco Use  . Smoking status: Never Smoker  . Smokeless tobacco: Never Used  Substance and Sexual Activity  . Alcohol use: No    Alcohol/week: 0.0 oz  . Drug use: No  . Sexual activity: Yes    Partners: Male    Birth control/protection: Surgical    Comment: BTL  Other Topics Concern  . Not on file  Social History Narrative  . Not on file    Mobility: Independent Work history: Does not work outside the home   Allergies  Allergen Reactions  . Azathioprine Itching  . Leflunomide Itching  . Sulfa Antibiotics     Unsure of reaction  . Sulfamethoxazole Rash    Pt does not remember    Family History  Problem Relation Age of Onset  . Osteoporosis Mother   . Deep vein thrombosis Mother      Prior to Admission medications   Medication Sig Start Date End Date Taking? Authorizing Provider  Adalimumab (HUMIRA) 20 MG/0.4ML PSKT Inject into the skin.   Yes [provider]  aspirin 81 MG tablet Take 81 mg by mouth daily.    [provider]  Calcium Citrate (CITRACAL PO) Take by mouth.    [provider]  Cholecalciferol (VITAMIN D PO) Take 2,000 Int'l Units by mouth daily.     [provider]  Cyanocobalamin (VITAMIN B 12 PO) Take by mouth daily.    [provider]  escitalopram (LEXAPRO) 10 MG tablet Take 10 mg by mouth daily.    [provider]  folic acid (FOLVITE) 1 MG tablet Take 1 mg by mouth daily.  03/26/13   [provider]  hydroxychloroquine (PLAQUENIL) 200 MG tablet Take by mouth daily.    [provider]  LORazepam (ATIVAN) 1 MG tablet Take 1 mg by mouth. 04/07/14   [provider]  methotrexate  (RHEUMATREX) 2.5 MG tablet Take 5 tabs (12.5 mg total) BY MOUTH ONCE A WEEK. ALL TABS IN A SINGLE DOSE 12/29/12   [provider]  naproxen (NAPROSYN) 500 MG tablet Take 500 mg by mouth 2 (two) times daily with a meal.    [provider]  Teriparatide, Recombinant, (FORTEO) 600 MCG/2.4ML SOLN Inject 20 mcg into the skin.    [provider]  traMADol (ULTRAM) 50 MG tablet Take 100 mg by mouth every 6 (six) hours as needed.    [provider]  UNABLE TO FIND 300 mg. Avocado po Takes 2 daily    [provider]    Physical Exam: Vitals:   11/14/16 0101 11/14/16 0543 11/14/16 0812 11/14/16 0958  BP: 130/89 125/63 112/63 (!) 127/54  Pulse: 76 65 76 74  Resp: 18 12  11   Temp:  98.3 F (36.8 C)  98 F (36.7 C)  TempSrc: Oral Oral  Oral  SpO2: 100% 99% 100% 100%  Weight:    48.8 kg (107 lb 9.6 oz)  Height:    _0  (1.6 m)      Constitutional: NAD, calm, comfortable Eyes: PERRL, lids and conjunctivae normal ENMT: Mucous membranes are moist.  Mild facial and upper neck swelling, cervical lymph nodes are boggy and minimally tender, unable to easily examine posterior pharynx noting patient complaining of jaw discomfort with attempts to open mouth; tongue is nonedematous Neck: abnormal with above-noted swelling and adenopathy, no thyromegaly-no stridor Respiratory: clear to auscultation bilaterally, no wheezing, no crackles. Normal respiratory effort. No accessory muscle use.  Cardiovascular: Regular rate and rhythm, no murmurs / rubs / gallops. No extremity edema. 2+ pedal pulses. No carotid bruits.  Abdomen: no tenderness, no masses palpated. No hepatosplenomegaly. Bowel sounds positive.  Musculoskeletal: no clubbing / cyanosis. No joint deformity upper and lower extremities. Good ROM, no contractures. Normal muscle tone.  Skin: no rashes, lesions, ulcers. No induration Neurologic: CN 2-12 grossly intact. Sensation intact, DTR normal. Strength 5/5 x  all 4 extremities.  Psychiatric: Normal judgment and insight. Alert and oriented x 3. Normal mood.    Labs on Admission: I have personally reviewed following labs and imaging studies  CBC: Recent Labs  Lab 11/14/16 0131  WBC 14.8*  NEUTROABS 9.4*  HGB 11.5*  HCT 35.1*  MCV 92.1  PLT 778   Basic Metabolic Panel: Recent Labs  Lab 11/14/16 0131  NA 140  K 3.2*  CL 107  CO2 25  GLUCOSE 121*  BUN 23*  CREATININE 0.79  CALCIUM 9.1   GFR: Estimated Creatinine Clearance: 56.2 mL/min (by C-G formula based on SCr of 0.79 mg/dL). Liver Function Tests: No results for input(s): AST, ALT, ALKPHOS, BILITOT, PROT, ALBUMIN in the last 168 hours. No results for input(s): LIPASE, AMYLASE in the last 168 hours. No results for input(s): AMMONIA in the last 168 hours. Coagulation Profile: No results for input(s): INR, PROTIME in the last 168 hours. Cardiac Enzymes: No results for input(s): CKTOTAL, CKMB, CKMBINDEX, TROPONINI in the last 168 hours. BNP (last 3 results) No results for input(s): PROBNP in the last 8760 hours. HbA1C: No results for input(s): HGBA1C in the last 72 hours. CBG: No results for input(s): GLUCAP in the last 168 hours. Lipid Profile: No results for input(s): CHOL, HDL, LDLCALC, TRIG, CHOLHDL, LDLDIRECT in the last 72 hours. Thyroid Function Tests: No results for input(s): TSH, T4TOTAL, FREET4, T3FREE, THYROIDAB in the last 72 hours. Anemia Panel: No results for input(s): VITAMINB12, FOLATE, FERRITIN, TIBC, IRON, RETICCTPCT in the last 72 hours. Urine analysis: No results found for: COLORURINE, APPEARANCEUR, LABSPEC, PHURINE, GLUCOSEU, HGBUR, BILIRUBINUR, KETONESUR, PROTEINUR, UROBILINOGEN, NITRITE, LEUKOCYTESUR Sepsis Labs: _1 (procalcitonin:4,lacticidven:4) )No results found for this or any previous visit (from the past 240 hour(s)).   Radiological Exams on Admission: Ct Soft Tissue Neck W Contrast  Result Date: 11/14/2016 CLINICAL DATA:  Swelling  of the tongue, lips and face for 8 hours. EXAM: CT NECK WITH CONTRAST TECHNIQUE: Multidetector CT imaging of the neck was performed using the standard protocol following the bolus administration of intravenous contrast. CONTRAST:  49m ISOVUE-300 IOPAMIDOL (ISOVUE-300) INJECTION 61% COMPARISON:  None. FINDINGS: Pharynx and larynx: --Nasopharynx: Fossae of Rosenmuller are clear. Normal adenoid tonsils for age. --Oral cavity and oropharynx: The palatine and lingual tonsils are normal. The visible oral cavity and floor of mouth are  normal. --Hypopharynx: Normal vallecula and pyriform sinuses. --Larynx: Normal epiglottis and pre-epiglottic space. Normal aryepiglottic and vocal folds. --Retropharyngeal space: There is a large retropharyngeal effusion extending from C2-C7. No retropharyngeal abscess. Salivary glands: --Parotid: No mass lesion or inflammation. No sialolithiasis or ductal dilatation. --Submandibular: There is hyper enhancement of both submandibular glands with surrounding edema. No sialolithiasis. --Sublingual: Normal. No ranula or other visible lesion of the base of tongue and floor of mouth. Thyroid: Normal. Lymph nodes: No enlarged or abnormal density lymph nodes. Vascular: Major cervical vessels are patent. Limited intracranial: Normal. Visualized orbits: Normal. Mastoids and visualized paranasal sinuses: No fluid levels or advanced mucosal thickening. No mastoid effusion. Skeleton: No bony spinal canal stenosis. No lytic or blastic lesions. Upper chest: Biapical scarring. Other: Extensive edema within the facial subcutaneous tissues, predominantly at the submental and submandibular locations. IMPRESSION: 1. Hyperattenuating appearance of the submandibular glands without sialolithiasis or evidence of ductal obstruction. Large amount of surrounding edema in the submandibular space and in the subcutaneous tissues of the submental neck, as well as a large retropharyngeal effusion. This may indicate  autoimmune sialadenitis, such as IgG 4-related disease or Sjogren disease, particularly if the patient has a history of autoimmune disease. Angioedema (such as that caused by ACE inhibitors) could cause this appearance, but the hyperdensity of the submandibular glands is atypical. Infectious sialadenitis (probably viral) is another consideration. Lymphoma is unlikely. 2. The retropharyngeal effusion is likely reactive. There is no retropharyngeal abscess. 3. If the patient has had radiation to the head and neck, postradiation change would become the most likely diagnosis. Electronically Signed   By: Ulyses Jarred M.D.   On: 11/14/2016 04:00      Assessment/Plan Principal Problem:   Neck swelling -Patient presents with 2 days of face, jaw and neck swelling with mild sore throat without any evidence of airway obstruction or abscess on CT scan -Differential includes autoimmune sialadenitis versus viral sialadenitis -Formal ENT consultation pending-NPO until completed -Normal saline IV at 100/hr -Respiratory viral panel -CRP, ESR -Solu-Medrol 60 mg IV every 6 hours -IV Toradol every 6 hours prn moderate pain -Tylenol prn mild pain -IV PPI while on NSAIDs and steroids  **Spoke with ENT/[facility]: No acute issues that would warrant formal ENT consultation.  Dr. Lucia Gaskins recommends contacting rheumatology since he suspects symptoms are more related to an exacerbation of her connective tissue disorder.  My attending physician attempted to contact Dr. Artis Delay at [facility]. He was unable to speak to him directly but left a HIPPA consistent VM with a return number.  Active Problems:   Mixed connective tissue disease  -Patient on Humira, methotrexate and Plaquenil at home -Resume Plaquenil       DVT prophylaxis: Lovenox Code Status: Full Family Communication: No family at bedside Disposition Plan: Home Consults called: ENT/[facility]man    Lunah Losasso L. ANP-BC [facility] Pager  [phone]   If 7PM-7AM, please contact night-coverage [email or web address] Password [email or web address]  11/14/2016, 10:40 AM

## 2016-11-14 NOTE — ED Provider Notes (Addendum)
[facility] EMERGENCY DEPARTMENT Provider Note   CSN: [id number] Arrival date & time: 11/14/16  0058     History   Chief Complaint Chief Complaint  Patient presents with  . Facial Swelling    HPI MICALA SALTSMAN is a 61 y.o. female.  The history is provided by the patient.  Illness  This is a recurrent problem. The current episode started 2 days ago. The problem occurs rarely. The problem has been rapidly worsening. Pertinent negatives include no chest pain, no abdominal pain, no headaches and no shortness of breath. Nothing aggravates the symptoms. Nothing relieves the symptoms. Treatments tried: steroid shot 11/7 and oral steroids since.  The treatment provided no relief.  Patient with mixed connective tissue disease who was seen by her PMD 11/7 and given shot of steroids IM and had unremarkable US of the neck who presents with onset of worse swelling of the anterior neck and lower half of the face as well as the tongue just PTA.  No f/c/r.  No drooling no change in phonation no dental pain.  No ACE-I  Past Medical History:  Diagnosis Date  . Autoimmune disorder (HCC)    pleurisy  . Mixed connective tissue disease (HCC)   . Osteoarthritis   . Osteopenia   . Osteoporosis   . Osteoporosis     Patient Active Problem List   Diagnosis Date Noted  . Osteopenia 03/29/2013  . Generalized OA 01/08/2012  . Connective tissue disease, undifferentiated (HCC) 01/06/2011    Past Surgical History:  Procedure Laterality Date  . TUBAL LIGATION     1983    OB History    Gravida Para Term Preterm AB Living   3 3 3     3    SAB TAB Ectopic Multiple Live Births                   Home Medications    Prior to Admission medications   Medication Sig Start Date End Date Taking? Authorizing Provider  Adalimumab (HUMIRA) 20 MG/0.4ML PSKT Inject into the skin.   Yes [provider]  aspirin 81 MG tablet Take 81 mg by mouth daily.    [provider]    Calcium Citrate (CITRACAL PO) Take by mouth.    [provider]  Cholecalciferol (VITAMIN D PO) Take 2,000 Int'l Units by mouth daily.     [provider]  Cyanocobalamin (VITAMIN B 12 PO) Take by mouth daily.    [provider]  escitalopram (LEXAPRO) 10 MG tablet Take 10 mg by mouth daily.    [provider]  folic acid (FOLVITE) 1 MG tablet Take 1 mg by mouth daily.  03/26/13   [provider]  hydroxychloroquine (PLAQUENIL) 200 MG tablet Take by mouth daily.    [provider]  LORazepam (ATIVAN) 1 MG tablet Take 1 mg by mouth. 04/07/14   [provider]  methotrexate (RHEUMATREX) 2.5 MG tablet Take 5 tabs (12.5 mg total) BY MOUTH ONCE A WEEK. ALL TABS IN A SINGLE DOSE 12/29/12   [provider]  naproxen (NAPROSYN) 500 MG tablet Take 500 mg by mouth 2 (two) times daily with a meal.    [provider]  Teriparatide, Recombinant, (FORTEO) 600 MCG/2.4ML SOLN Inject 20 mcg into the skin.    [provider]  traMADol (ULTRAM) 50 MG tablet Take 100 mg by mouth every 6 (six) hours as needed.    [provider]  UNABLE TO FIND  300 mg. Avocado po Takes 2 daily    [provider]    Family History Family History  Problem Relation Age of Onset  . Osteoporosis Mother   . Deep vein thrombosis Mother     Social History Social History   Tobacco Use  . Smoking status: Never Smoker  . Smokeless tobacco: Never Used  Substance Use Topics  . Alcohol use: No    Alcohol/week: 0.0 oz  . Drug use: No     Allergies   Azathioprine; Leflunomide; Sulfa antibiotics; and Sulfamethoxazole   Review of Systems Review of Systems  Constitutional: Negative for diaphoresis and fever.  HENT: Positive for facial swelling. Negative for drooling, trouble swallowing and voice change.   Respiratory: Negative for shortness of breath and stridor.   Cardiovascular: Negative for chest pain.   Gastrointestinal: Negative for abdominal pain.  Neurological: Negative for headaches.  All other systems reviewed and are negative.    Physical Exam Updated Vital Signs BP 130/89 (BP Location: Right Arm)   Pulse 76   Resp 18   SpO2 100%   Physical Exam  Constitutional: She is oriented to person, place, and time. She appears well-developed and well-nourished. No distress.  HENT:  Head: Normocephalic.    Right Ear: External ear normal.  Left Ear: External ear normal.  Mouth/Throat: No trismus in the jaw. No uvula swelling.    Eyes: Conjunctivae are normal. Pupils are equal, round, and reactive to light.  Neck: Normal range of motion. Neck supple. No tracheal deviation present.  Cardiovascular: Normal rate, regular rhythm, normal heart sounds and intact distal pulses.  Pulmonary/Chest: Effort normal and breath sounds normal. No stridor. No respiratory distress. She has no wheezes. She has no rales.  Abdominal: Soft. Bowel sounds are normal. There is no tenderness. There is no guarding.  Musculoskeletal: Normal range of motion.  Neurological: She is alert and oriented to person, place, and time. She displays normal reflexes.  Skin: Skin is warm and dry. Capillary refill takes less than 2 seconds.  Psychiatric: She has a normal mood and affect.  Nursing note and vitals reviewed.    ED Treatments / Results  Labs (all labs ordered are listed, but only abnormal results are displayed) Labs Reviewed  CBC WITH DIFFERENTIAL/PLATELET - Abnormal; Notable for the following components:      Result Value   WBC 14.8 (*)    RBC 3.81 (*)    Hemoglobin 11.5 (*)    HCT 35.1 (*)    RDW 16.3 (*)    Neutro Abs 9.4 (*)    Monocytes Absolute 1.8 (*)    All other components within normal limits  BASIC METABOLIC PANEL - Abnormal; Notable for the following components:   Potassium 3.2 (*)    Glucose, Bld 121 (*)    BUN 23 (*)    All other components within normal limits    EKG  EKG  Interpretation None       Radiology Ct Soft Tissue Neck W Contrast  Result Date: 11/14/2016 CLINICAL DATA:  Swelling of the tongue, lips and face for 8 hours. EXAM: CT NECK WITH CONTRAST TECHNIQUE: Multidetector CT imaging of the neck was performed using the standard protocol following the bolus administration of intravenous contrast. CONTRAST:  76mL ISOVUE-300 IOPAMIDOL (ISOVUE-300) INJECTION 61% COMPARISON:  None. FINDINGS: Pharynx and larynx: --Nasopharynx: Fossae of Rosenmuller are clear. Normal adenoid tonsils for age. --Oral cavity and oropharynx: The palatine and lingual tonsils are normal. The visible oral cavity and floor  of mouth are normal. --Hypopharynx: Normal vallecula and pyriform sinuses. --Larynx: Normal epiglottis and pre-epiglottic space. Normal aryepiglottic and vocal folds. --Retropharyngeal space: There is a large retropharyngeal effusion extending from C2-C7. No retropharyngeal abscess. Salivary glands: --Parotid: No mass lesion or inflammation. No sialolithiasis or ductal dilatation. --Submandibular: There is hyper enhancement of both submandibular glands with surrounding edema. No sialolithiasis. --Sublingual: Normal. No ranula or other visible lesion of the base of tongue and floor of mouth. Thyroid: Normal. Lymph nodes: No enlarged or abnormal density lymph nodes. Vascular: Major cervical vessels are patent. Limited intracranial: Normal. Visualized orbits: Normal. Mastoids and visualized paranasal sinuses: No fluid levels or advanced mucosal thickening. No mastoid effusion. Skeleton: No bony spinal canal stenosis. No lytic or blastic lesions. Upper chest: Biapical scarring. Other: Extensive edema within the facial subcutaneous tissues, predominantly at the submental and submandibular locations. IMPRESSION: 1. Hyperattenuating appearance of the submandibular glands without sialolithiasis or evidence of ductal obstruction. Large amount of surrounding edema in the submandibular space  and in the subcutaneous tissues of the submental neck, as well as a large retropharyngeal effusion. 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 Angioedema (such as that caused by ACE inhibitors) could cause this appearance, but the hyperdensity of the submandibular glands is atypical. Infectious sialadenitis (probably viral) is another consideration. Lymphoma is unlikely. 2. The retropharyngeal effusion is likely reactive. There is no retropharyngeal abscess. 3. If the patient has had radiation to the head and neck, postradiation change would become the most likely diagnosis. Electronically Signed   By: Deatra Robinson M.D.   On: 11/14/2016 04:00    Procedures Procedures (including critical care time)  Medications Ordered in ED Medications  EPINEPHrine (EPI-PEN) injection 0.3 mg (0.3 mg Intramuscular Given 11/14/16 0110)  methylPREDNISolone sodium succinate (SOLU-MEDROL) 125 mg/2 mL injection 125 mg (125 mg Intravenous Given 11/14/16 0112)  famotidine (PEPCID) IVPB 20 mg premix (0 mg Intravenous Stopped 11/14/16 0136)  diphenhydrAMINE (BENADRYL) injection 50 mg (50 mg Intravenous Given 11/14/16 0113)  iopamidol (ISOVUE-300) 61 % injection 100 mL (75 mLs Intravenous Contrast Given 11/14/16 0315)   MDM Reviewed: previous chart, nursing note and vitals Interpretation: labs and CT scan (elevated WBC count normal epiglottis by me on CT) Total time providing critical care: 75-105 minutes. This excludes time spent performing separately reportable procedures and services. Consults: admitting MD and critical care (ENT)  CRITICAL CARE Performed by: Jasmine Awe Total critical care time: 75 minutes Critical care time was exclusive of separately billable procedures and treating other patients. Critical care was necessary to treat or prevent imminent or life-threatening  deterioration. Critical care was time spent personally by me on the following activities: development of treatment plan with patient and/or surrogate as well as nursing, discussions with consultants, evaluation of patient's response to treatment, examination of patient, obtaining history from patient or surrogate, ordering and performing treatments and interventions, ordering and review of laboratory studies, ordering and review of radiographic studies, pulse oximetry and re-evaluation of patient's condition.   340 Case d/w Dr. Ezzard Standing of ENT who will see the patient in consult  4 am Case d/w Dr. Darrick Penna of PCCM there is no indication for admission to the the ICU at this time given, please admit to the hospitalist   Final Clinical Impressions(s) / ED Diagnoses   Will admit to stepdown for ongoing steroids and care.  Airway is stable and O2 sat is normal on room  air and patient has intact phonation and is tolerating secretions    Raeya Merritts, MD 11/14/16 [id number]    Nicanor AlconPalumbo, Betzalel Umbarger, MD 11/14/16 [id number]

## 2016-11-14 NOTE — Progress Notes (Signed)
Pt arrived to unit 4E19 via EMS. Pt on strecher. Pt ambulated of strecher to bed. Pt oriented and moves independently in rm. C/O no pain. Edema noted around neck and facial edema. V/S obtained. See chart. Assessment done see chart. Dr. Maury Dus and made aware of pt. Will continue to monitor.

## 2016-11-14 NOTE — Plan of Care (Signed)
  Education: Knowledge of General Education information will improve 11/14/2016 2049 - Progressing by Renelda Mom, RN Note Discussed need to call not fall, and unit routine

## 2016-11-14 NOTE — ED Triage Notes (Signed)
Pt with facial swelling since 8 pm last PM.

## 2016-11-14 NOTE — ED Notes (Signed)
[facility] at bedside 

## 2016-11-14 NOTE — ED Provider Notes (Signed)
Patient accepted at shift change from Dr. Daun Peacock.  She is being observed until admission for upper airway edema and facial swelling.  Patient has been treated with Solu-Medrol, Benadryl, Pepcid and epinephrine.  Mild improvement of symptoms. Patient has been [facility] to [facility] reviewed with Dr. Antionette Char and Dr. Ezzard Standing ENT. Physical Exam  BP 125/63 (BP Location: Left Arm)   Pulse 65   Temp 98.3 F (36.8 C) (Oral)   Resp 12   SpO2 99%   Physical Exam Patient is resting quietly without acute distress.  No respiratory distress at rest.  Awakens to normal and clear mental status.  Voice is mildly hoarse but speaking in full clear sentences.  Patient has moderate lower facial swelling.  Mouth opening  Mallampati score 4.  Tongue does not protrude at rest, mouth closes without difficulty. With tongue depressor Mallampati score 3, top of uvula is visualized.  Posterior airway does not appear inflamed.  Neck is supple without meningismus. ED Course  Procedures  MDM Reportedly transport is on route for transfer to [facility].  Patient is stable for transport will continue to observe.      Arby Barrette, MD 11/14/16 [phone]

## 2016-11-15 ENCOUNTER — Encounter (HOSPITAL_COMMUNITY): Payer: Self-pay | Admitting: Emergency Medicine

## 2016-11-15 ENCOUNTER — Other Ambulatory Visit: Payer: Self-pay

## 2016-11-15 DIAGNOSIS — M359 Systemic involvement of connective tissue, unspecified: Secondary | ICD-10-CM

## 2016-11-15 DIAGNOSIS — M351 Other overlap syndromes: Secondary | ICD-10-CM | POA: Diagnosis not present

## 2016-11-15 DIAGNOSIS — R221 Localized swelling, mass and lump, neck: Secondary | ICD-10-CM | POA: Diagnosis not present

## 2016-11-15 LAB — COMPREHENSIVE METABOLIC PANEL
ALK PHOS: 45 U/L (ref 38–126)
ALT: 16 U/L (ref 14–54)
AST: 22 U/L (ref 15–41)
Albumin: 3.1 g/dL — ABNORMAL LOW (ref 3.5–5.0)
Anion gap: 7 (ref 5–15)
BILIRUBIN TOTAL: 0.5 mg/dL (ref 0.3–1.2)
BUN: 19 mg/dL (ref 6–20)
CALCIUM: 8.8 mg/dL — AB (ref 8.9–10.3)
CO2: 23 mmol/L (ref 22–32)
CREATININE: 0.58 mg/dL (ref 0.44–1.00)
Chloride: 112 mmol/L — ABNORMAL HIGH (ref 101–111)
Glucose, Bld: 131 mg/dL — ABNORMAL HIGH (ref 65–99)
Potassium: 3.7 mmol/L (ref 3.5–5.1)
Sodium: 142 mmol/L (ref 135–145)
TOTAL PROTEIN: 5.5 g/dL — AB (ref 6.5–8.1)

## 2016-11-15 LAB — CBC
HCT: 32 % — ABNORMAL LOW (ref 36.0–46.0)
Hemoglobin: 10.1 g/dL — ABNORMAL LOW (ref 12.0–15.0)
MCH: 29.5 pg (ref 26.0–34.0)
MCHC: 31.6 g/dL (ref 30.0–36.0)
MCV: 93.6 fL (ref 78.0–100.0)
PLATELETS: 247 10*3/uL (ref 150–400)
RBC: 3.42 MIL/uL — ABNORMAL LOW (ref 3.87–5.11)
RDW: 16.8 % — AB (ref 11.5–15.5)
WBC: 6.1 10*3/uL (ref 4.0–10.5)

## 2016-11-15 LAB — RESPIRATORY PANEL BY PCR
Adenovirus: NOT DETECTED
BORDETELLA PERTUSSIS-RVPCR: NOT DETECTED
CHLAMYDOPHILA PNEUMONIAE-RVPPCR: NOT DETECTED
Coronavirus 229E: NOT DETECTED
Coronavirus HKU1: NOT DETECTED
Coronavirus NL63: NOT DETECTED
Coronavirus OC43: NOT DETECTED
INFLUENZA A-RVPPCR: NOT DETECTED
INFLUENZA B-RVPPCR: NOT DETECTED
MYCOPLASMA PNEUMONIAE-RVPPCR: NOT DETECTED
Metapneumovirus: NOT DETECTED
PARAINFLUENZA VIRUS 3-RVPPCR: NOT DETECTED
PARAINFLUENZA VIRUS 4-RVPPCR: NOT DETECTED
Parainfluenza Virus 1: NOT DETECTED
Parainfluenza Virus 2: NOT DETECTED
RESPIRATORY SYNCYTIAL VIRUS-RVPPCR: NOT DETECTED
RHINOVIRUS / ENTEROVIRUS - RVPPCR: NOT DETECTED

## 2016-11-15 LAB — HIV ANTIBODY (ROUTINE TESTING W REFLEX): HIV Screen 4th Generation wRfx: NONREACTIVE

## 2016-11-15 MED ORDER — PREDNISONE 20 MG PO TABS: ORAL_TABLET | ORAL | 0 refills | Status: DC

## 2016-11-15 NOTE — Progress Notes (Signed)
Pt discharged home with husband. IVs and telemetry box removed. Pt received discharge instructions and all questions were answered. Pt discharged with all of her belongings. Pt left the unit via wheelchair and was accompanied by this RN.   Berdine Dance BSN, RN

## 2016-11-15 NOTE — Discharge Summary (Signed)
Physician Discharge Summary  Patricia Oconnell OOI:[id number] DOB: 10/11/1955 DOA: 11/14/2016  PCP: Thomes Dinning, MD  Admit date: 11/14/2016 Discharge date: 11/15/2016  Admitted From: Home  Disposition:  Home  Recommendations for Outpatient Follow-up:  1. Follow up with PCP in 1-weeks 2. Patient has been placed on steroid taper with prednisone 3. Follow up with Dr. Sherryll Burger, Rheumatology at [facility] next week  Home Health: No Equipment/Devices: No   Discharge Condition: Stable CODE STATUS: Full  Diet recommendation: Regular diet  Brief/Interim Summary: 61 year old female presented with chief complaint of neck, jaw and facial swelling. Patient does have significant past history of connective tissue disease, on immunosuppressive therapy. Patient complained of 48 hours of left shoulder pain, radiated to her neck and jaw, she received steroids outpatient with mild improvement. 24 hours before admission she complained of sore throat and lip swelling, neck CT scan shows significant edema at the submandibular space with large retropharyngeal effusion. On the initial physical examination, blood pressure 127/54, heart rate 74, temperature 98., respiratory rate 11, oxygen saturation 100%. Moist mucous membranes, neck with edema at the submandibular region, no stridor or thyromegaly, lungs clear to auscultation bilaterally, no wheezing, rales or rhonchi, heart S1-S2 present rhythmic, abdomen soft nontender, no lower extremity edema. Sodium 140, potassium 3.2, chloride 107, bicarbonate 25, glucose 121, BUN 23 creatinine 0.79, white count 14.8, hemoglobin 11.5, hematocrit 35.1, platelets 350. CRP 1.7, sedimentation rate 7. Soft tissue CT scan with large amount of surrounding edema in the submandibular space and in the subcutaneous tissues of the submental neck, as well as a large retropharyngeal effusion, adjacent autoimmune sialadenitis, no abscess.  Patient was admitted to hospital with the working diagnosis of  neck edema connective tissue disease flare up   1. Mixed connective tissue disease flare up. Patient was admitted to the stepdown unit, she received IV steroids with Solu-Medrol, H1 and H2 blockade, she had significant improvement of her symptoms, the following day after admission her edema had almost completely resolved, there is no signs of airway compromise. Dr. Ezzard Standing from ENT, is consulted over the phone and recommended no further ENT intervention. Patient has had flareups of his autoimmune condition in the past, which were represented with edema and joint pain in her upper extremities, treated with steroids. Patient will be discharged on a prednisone taper, recommendations to follow-up with rheumatology next week, she has been advised to return to the hospital if recurrent swelling or dyspnea.        Discharge Diagnoses:  Principal Problem:   Neck swelling Active Problems:   Mixed connective tissue disease (HCC)    Discharge Instructions   Allergies as of 11/15/2016      Reactions   Azathioprine Itching   Leflunomide Itching   Sulfa Antibiotics    Unsure of reaction   Sulfamethoxazole Rash   Pt does not remember      Medication List    TAKE these medications   aspirin 81 MG tablet Take 81 mg by mouth daily.   bimatoprost 0.03 % ophthalmic solution Commonly known as:  LATISSE Place one drop on applicator and apply evenly along the skin of the upper eyelid at base of eyelashes once daily at bedtime; repeat procedure for second eye (use a clean applicator).   CITRACAL PO Take 2 tablets daily by mouth.   CULTURELLE Caps Take 1 capsule daily by mouth.   escitalopram 10 MG tablet Commonly known as:  LEXAPRO Take 10 mg by mouth daily.   folic acid 1 MG  tablet Commonly known as:  FOLVITE Take 1 mg by mouth daily.   FORTEO 600 MCG/2.4ML Soln Generic drug:  Teriparatide (Recombinant) Inject 20 mcg into the skin.   HUMIRA 20 MG/0.4ML Pskt Generic drug:   Adalimumab Inject into the skin.   hydroxychloroquine 200 MG tablet Commonly known as:  PLAQUENIL Take by mouth daily.   LORazepam 1 MG tablet Commonly known as:  ATIVAN Take 0.5 mg at bedtime by mouth.   methotrexate 2.5 MG tablet Commonly known as:  RHEUMATREX Take 25 mg once a week by mouth. Take 5 tabs (12.5 mg total) BY MOUTH ONCE A WEEK. ALL TABS IN A SINGLE DOSE   naproxen 500 MG tablet Commonly known as:  NAPROSYN Take 500 mg by mouth 2 (two) times daily with a meal.   predniSONE 20 MG tablet Commonly known as:  DELTASONE Take 3 tablets for 4 days, then 2 tablets for 4 days, then 1, tablet for 4 days, then half tablet for 4 days. What changed:    medication strength  how much to take  how to take this  when to take this  additional instructions   traMADol 50 MG tablet Commonly known as:  ULTRAM Take 100 mg by mouth every 6 (six) hours as needed.   VITAMIN B 12 PO Take by mouth daily.   VITAMIN D PO Take 2,000 Int'l Units by mouth daily.       Allergies  Allergen Reactions  . Azathioprine Itching  . Leflunomide Itching  . Sulfa Antibiotics     Unsure of reaction  . Sulfamethoxazole Rash    Pt does not remember    Consultations:  ENT over the phone    Procedures/Studies: Ct Soft Tissue Neck W Contrast  Result Date: 11/14/2016 CLINICAL DATA:  Swelling of the tongue, lips and face for 8 hours. EXAM: CT NECK WITH CONTRAST TECHNIQUE: Multidetector CT imaging of the neck was performed using the standard protocol following the bolus administration of intravenous contrast. CONTRAST:  72mL ISOVUE-300 IOPAMIDOL (ISOVUE-300) INJECTION 61% COMPARISON:  None. FINDINGS: Pharynx and larynx: --Nasopharynx: Fossae of Rosenmuller are clear. Normal adenoid tonsils for age. --Oral cavity and oropharynx: The palatine and lingual tonsils are normal. The visible oral cavity and floor of mouth are normal. --Hypopharynx: Normal vallecula and pyriform sinuses. --Larynx:  Normal epiglottis and pre-epiglottic space. Normal aryepiglottic and vocal folds. --Retropharyngeal space: There is a large retropharyngeal effusion extending from C2-C7. No retropharyngeal abscess. Salivary glands: --Parotid: No mass lesion or inflammation. No sialolithiasis or ductal dilatation. --Submandibular: There is hyper enhancement of both submandibular glands with surrounding edema. No sialolithiasis. --Sublingual: Normal. No ranula or other visible lesion of the base of tongue and floor of mouth. Thyroid: Normal. Lymph nodes: No enlarged or abnormal density lymph nodes. Vascular: Major cervical vessels are patent. Limited intracranial: Normal. Visualized orbits: Normal. Mastoids and visualized paranasal sinuses: No fluid levels or advanced mucosal thickening. No mastoid effusion. Skeleton: No bony spinal canal stenosis. No lytic or blastic lesions. Upper chest: Biapical scarring. Other: Extensive edema within the facial subcutaneous tissues, predominantly at the submental and submandibular locations. IMPRESSION: 1. Hyperattenuating appearance of the submandibular glands without sialolithiasis or evidence of ductal obstruction. Large amount of surrounding edema in the submandibular space and in the subcutaneous tissues of the submental neck, as well as a large retropharyngeal effusion. 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 Angioedema (such as that caused  by ACE inhibitors) could cause this appearance, but the hyperdensity of the submandibular glands is atypical. Infectious sialadenitis (probably viral) is another consideration. Lymphoma is unlikely. 2. The retropharyngeal effusion is likely reactive. There is no retropharyngeal abscess. 3. If the patient has had radiation to the head and neck, postradiation change would become the most likely diagnosis. Electronically Signed   By: Deatra Robinson M.D.   On: 11/14/2016 04:00   Mm Screening Breast Tomo Bilateral  Result Date: 10/30/2016 CLINICAL DATA:  Screening. EXAM: 2D DIGITAL SCREENING BILATERAL MAMMOGRAM WITH CAD AND ADJUNCT TOMO COMPARISON:  Previous exam(s). ACR Breast Density Category c: The breast tissue is heterogeneously dense, which may obscure small masses. FINDINGS: There are no findings suspicious for malignancy. Images were processed with CAD. IMPRESSION: No mammographic evidence of malignancy. A result letter of this screening mammogram will be mailed directly to the patient. RECOMMENDATION: Screening mammogram in one year. (Code:SM-B-01Y) BI-RADS CATEGORY  1: Negative. Electronically Signed   By: Britta Mccreedy M.D.   On: 10/30/2016 16:23       Subjective: Patient feeling better, no nausea or vomiting, no chest pain, or dyspnea, no odynophagia.   Discharge Exam: Vitals:   11/15/16 0448 11/15/16 0928  BP: (!) 110/54 (!) 117/50  Pulse: (!) 57 64  Resp: 14 11  Temp: 98.5 F (36.9 C) 97.9 F (36.6 C)  SpO2: 99% 98%   Vitals:   11/14/16 1738 11/14/16 2018 11/15/16 0448 11/15/16 0928  BP: (!) 116/54 (!) 111/53 (!) 110/54 (!) 117/50  Pulse: 69 68 (!) 57 64  Resp: 13 10 14 11   Temp: 99.4 F (37.4 C) 99 F (37.2 C) 98.5 F (36.9 C) 97.9 F (36.6 C)  TempSrc: Oral Oral Oral Oral  SpO2: 99% 99% 99% 98%  Weight:      Height:        General: Pt is alert, awake, not in acute distress Neck; no masses, no edema, no thyromegaly or stridor Cardiovascular: RRR, S1/S2 +, no rubs, no gallops Respiratory: CTA bilaterally, no wheezing, no rhonchi Abdominal: Soft, NT, ND, bowel sounds + Extremities: no edema, no cyanosis    The results of significant diagnostics from this hospitalization (including imaging, microbiology, ancillary and laboratory) are listed below for reference.     Microbiology: Recent Results (from the past 240 hour(s))  Respiratory Panel by PCR     Status: None   Collection Time:  11/15/16  3:54 AM  Result Value Ref Range Status   Adenovirus NOT DETECTED NOT DETECTED Final   Coronavirus 229E NOT DETECTED NOT DETECTED Final   Coronavirus HKU1 NOT DETECTED NOT DETECTED Final   Coronavirus NL63 NOT DETECTED NOT DETECTED Final   Coronavirus OC43 NOT DETECTED NOT DETECTED Final   Metapneumovirus NOT DETECTED NOT DETECTED Final   Rhinovirus / Enterovirus NOT DETECTED NOT DETECTED Final   Influenza A NOT DETECTED NOT DETECTED Final   Influenza B NOT DETECTED NOT DETECTED Final   Parainfluenza Virus 1 NOT DETECTED NOT DETECTED Final   Parainfluenza Virus 2 NOT DETECTED NOT DETECTED Final   Parainfluenza Virus 3 NOT DETECTED NOT DETECTED Final   Parainfluenza Virus 4 NOT DETECTED NOT DETECTED Final   Respiratory Syncytial Virus NOT DETECTED NOT DETECTED Final   Bordetella pertussis NOT DETECTED NOT DETECTED Final   Chlamydophila pneumoniae NOT DETECTED NOT DETECTED Final   Mycoplasma pneumoniae NOT DETECTED NOT DETECTED Final     Labs: BNP (last 3 results) No results for input(s): BNP in the last 8760  hours. Basic Metabolic Panel: Recent Labs  Lab 11/14/16 0131 11/15/16 0313  NA 140 142  K 3.2* 3.7  CL 107 112*  CO2 25 23  GLUCOSE 121* 131*  BUN 23* 19  CREATININE 0.79 0.58  CALCIUM 9.1 8.8*   Liver Function Tests: Recent Labs  Lab 11/15/16 0313  AST 22  ALT 16  ALKPHOS 45  BILITOT 0.5  PROT 5.5*  ALBUMIN 3.1*   No results for input(s): LIPASE, AMYLASE in the last 168 hours. No results for input(s): AMMONIA in the last 168 hours. CBC: Recent Labs  Lab 11/14/16 0131 11/15/16 0313  WBC 14.8* 6.1  NEUTROABS 9.4*  --   HGB 11.5* 10.1*  HCT 35.1* 32.0*  MCV 92.1 93.6  PLT 350 247   Cardiac Enzymes: No results for input(s): CKTOTAL, CKMB, CKMBINDEX, TROPONINI in the last 168 hours. BNP: Invalid input(s): POCBNP CBG: No results for input(s): GLUCAP in the last 168 hours. D-Dimer No results for input(s): DDIMER in the last 72 hours. Hgb  A1c No results for input(s): HGBA1C in the last 72 hours. Lipid Profile No results for input(s): CHOL, HDL, LDLCALC, TRIG, CHOLHDL, LDLDIRECT in the last 72 hours. Thyroid function studies No results for input(s): TSH, T4TOTAL, T3FREE, THYROIDAB in the last 72 hours.  Invalid input(s): FREET3 Anemia work up No results for input(s): VITAMINB12, FOLATE, FERRITIN, TIBC, IRON, RETICCTPCT in the last 72 hours. Urinalysis No results found for: COLORURINE, APPEARANCEUR, LABSPEC, PHURINE, GLUCOSEU, HGBUR, BILIRUBINUR, KETONESUR, PROTEINUR, UROBILINOGEN, NITRITE, LEUKOCYTESUR Sepsis Labs Invalid input(s): PROCALCITONIN,  WBC,  LACTICIDVEN Microbiology Recent Results (from the past 240 hour(s))  Respiratory Panel by PCR     Status: None   Collection Time: 11/15/16  3:54 AM  Result Value Ref Range Status   Adenovirus NOT DETECTED NOT DETECTED Final   Coronavirus 229E NOT DETECTED NOT DETECTED Final   Coronavirus HKU1 NOT DETECTED NOT DETECTED Final   Coronavirus NL63 NOT DETECTED NOT DETECTED Final   Coronavirus OC43 NOT DETECTED NOT DETECTED Final   Metapneumovirus NOT DETECTED NOT DETECTED Final   Rhinovirus / Enterovirus NOT DETECTED NOT DETECTED Final   Influenza A NOT DETECTED NOT DETECTED Final   Influenza B NOT DETECTED NOT DETECTED Final   Parainfluenza Virus 1 NOT DETECTED NOT DETECTED Final   Parainfluenza Virus 2 NOT DETECTED NOT DETECTED Final   Parainfluenza Virus 3 NOT DETECTED NOT DETECTED Final   Parainfluenza Virus 4 NOT DETECTED NOT DETECTED Final   Respiratory Syncytial Virus NOT DETECTED NOT DETECTED Final   Bordetella pertussis NOT DETECTED NOT DETECTED Final   Chlamydophila pneumoniae NOT DETECTED NOT DETECTED Final   Mycoplasma pneumoniae NOT DETECTED NOT DETECTED Final     Time coordinating discharge: 45 minutes  SIGNED:   Coralie Keens, MD  [facility] 11/15/2016, 12:12 PM Pager [phone]  If 7PM-7AM, please contact  night-coverage [email or web address] Password [email or web address]

## 2016-11-19 LAB — CULTURE, BLOOD (ROUTINE X 2)
CULTURE: NO GROWTH
CULTURE: NO GROWTH
SPECIAL REQUESTS: ADEQUATE
Special Requests: ADEQUATE

## 2016-11-25 ENCOUNTER — Encounter: Payer: Self-pay | Admitting: Certified Nurse Midwife

## 2016-11-25 ENCOUNTER — Ambulatory Visit (INDEPENDENT_AMBULATORY_CARE_PROVIDER_SITE_OTHER): Payer: BLUE CROSS/BLUE SHIELD | Admitting: Certified Nurse Midwife

## 2016-11-25 ENCOUNTER — Other Ambulatory Visit: Payer: Self-pay

## 2016-11-25 ENCOUNTER — Other Ambulatory Visit (HOSPITAL_COMMUNITY)
Admission: RE | Admit: 2016-11-25 | Discharge: 2016-11-25 | Disposition: A | Discharge status: Home / Self Care | Payer: BLUE CROSS/BLUE SHIELD | Source: Ambulatory Visit | Attending: Obstetrics & Gynecology | Admitting: Obstetrics & Gynecology

## 2016-11-25 VITALS — BP 120/60 | HR 80 | Resp 16 | Ht 63.0 in | Wt 110.0 lb

## 2016-11-25 DIAGNOSIS — Z124 Encounter for screening for malignant neoplasm of cervix: Secondary | ICD-10-CM | POA: Diagnosis not present

## 2016-11-25 DIAGNOSIS — Z0000 Encounter for general adult medical examination without abnormal findings: Secondary | ICD-10-CM

## 2016-11-25 DIAGNOSIS — M810 Age-related osteoporosis without current pathological fracture: Secondary | ICD-10-CM | POA: Insufficient documentation

## 2016-11-25 DIAGNOSIS — M1990 Unspecified osteoarthritis, unspecified site: Secondary | ICD-10-CM | POA: Insufficient documentation

## 2016-11-25 DIAGNOSIS — Z01419 Encounter for gynecological examination (general) (routine) without abnormal findings: Secondary | ICD-10-CM | POA: Diagnosis not present

## 2016-11-25 LAB — POCT URINALYSIS DIPSTICK
Bilirubin, UA: NEGATIVE
Glucose, UA: NEGATIVE
Ketones, UA: NEGATIVE
Leukocytes, UA: NEGATIVE
Nitrite, UA: NEGATIVE
PH UA: 7 (ref 5.0–8.0)
PROTEIN UA: NEGATIVE
RBC UA: NEGATIVE
UROBILINOGEN UA: NEGATIVE U/dL — AB

## 2016-11-25 NOTE — Patient Instructions (Signed)

## 2016-11-25 NOTE — Progress Notes (Signed)
61 y.o. [id number] Married  Caucasian Fe here for annual exam. Menopausal no HRT. Denies vaginal bleeding or vaginal dryness. Was seen in ER  several days with flare of connective tissue disorder and now coming off prednisone for use. Continues follow up at [facility] for this autoimmune disorder, now on Humira. Weight holding at 110. Has decreased Plaquenil to one daily now. Taking Forteo for osteoporosis with Glennie Hawk at [facility]. Could not complete colonoscopy due to inadequate bowel prep. "Did the best I could". Would like to do Cologard if possible. No other health issues today.  No LMP recorded. Patient is postmenopausal.          Sexually active: No.  The current method of family planning is tubal ligation.    Exercising: No.  exercise Smoker:  no  Health Maintenance: Pap:  02-27-12 neg, 08-01-14 neg HPV HR neg History of Abnormal Pap: no MMG:  10-30-16 category c density birads 1:neg Self Breast exams: yes Colonoscopy:  2007 f/u has had another one with no success in 2017 due in adequate prep.Marland Kitchen BMD:  6-8 mths ago on Forteo TDaP:  2017 Shingles: chart says 2017, per patient she didn't do it Pneumonia: 2015 Hep C and HIV: HIV neg 2018 Labs: poct urine-neg, ph 7.0   reports that  has never smoked. she has never used smokeless tobacco. She reports that she does not drink alcohol or use drugs.  Past Medical History:  Diagnosis Date  . Autoimmune disorder (HCC)    pleurisy  . Mixed connective tissue disease (HCC)   . Osteoarthritis   . Osteopenia   . Osteoporosis   . Osteoporosis     Past Surgical History:  Procedure Laterality Date  . TUBAL LIGATION     1983    Current Outpatient Medications  Medication Sig Dispense Refill  . Adalimumab (HUMIRA) 20 MG/0.4ML PSKT Inject into the skin. Injection every 14 days    . aspirin 81 MG tablet Take 81 mg by mouth daily.    . bimatoprost (LATISSE) 0.03 % ophthalmic solution Place one drop on applicator and apply evenly along  the skin of the upper eyelid at base of eyelashes once daily at bedtime; repeat procedure for second eye (use a clean applicator).     . Calcium Citrate (CITRACAL PO) Take 2 tablets daily by mouth.     . Cholecalciferol (VITAMIN D PO) Take 2,000 Int'l Units by mouth daily.     . Cyanocobalamin (VITAMIN B 12 PO) Take by mouth daily.    Marland Kitchen escitalopram (LEXAPRO) 10 MG tablet Take 10 mg by mouth daily.    . folic acid (FOLVITE) 1 MG tablet Take 1 mg by mouth daily.     . hydroxychloroquine (PLAQUENIL) 200 MG tablet Take by mouth daily.    Marland Kitchen LORazepam (ATIVAN) 1 MG tablet Take 0.5 mg at bedtime by mouth.     . methotrexate (RHEUMATREX) 2.5 MG tablet Take by mouth. Take 10 tabs BY MOUTH ONCE A WEEK. ALL TABS IN A SINGLE DOSE    . naproxen (NAPROSYN) 500 MG tablet Take 500 mg by mouth 2 (two) times daily with a meal.    . predniSONE (DELTASONE) 20 MG tablet Take 3 tablets for 4 days, then 2 tablets for 4 days, then 1, tablet for 4 days, then half tablet for 4 days. 30 tablet 0  . Probiotic Product (PROBIOTIC PO) Take by mouth.    . Teriparatide, Recombinant, (FORTEO) 600 MCG/2.4ML SOLN Inject 20 mcg into  the skin.    Marland Kitchen traMADol (ULTRAM) 50 MG tablet Take by mouth. Takes 4-6     No current facility-administered medications for this visit.     Family History  Problem Relation Age of Onset  . Osteoporosis Mother   . Deep vein thrombosis Mother     ROS:  Pertinent items are noted in HPI.  Otherwise, a comprehensive ROS was negative.  Exam:   BP 120/60   Pulse 80   Resp 16   Ht 5\' 3"  (1.6 m)   Wt 110 lb (49.9 kg)   BMI 19.49 kg/m  Height: 5\' 3"  (160 cm) Ht Readings from Last 3 Encounters:  11/25/16 5\' 3"  (1.6 m)  11/14/16 5\' 3"  (1.6 m)  08/01/14 5' 3.25" (1.607 m)    General appearance: alert, cooperative and appears stated age Head: Normocephalic, without obvious abnormality, atraumatic Neck: no adenopathy, supple, symmetrical, trachea midline and thyroid normal to inspection and  palpation Lungs: clear to auscultation bilaterally Breasts: normal appearance, no masses or tenderness, No nipple retraction or dimpling, No nipple discharge or bleeding, No axillary or supraclavicular adenopathy Heart: regular rate and rhythm Abdomen: soft, non-tender; no masses,  no organomegaly Extremities: extremities normal, atraumatic, no cyanosis or edema Skin: Skin color, texture, turgor normal. No rashes or lesions Lymph nodes: Cervical, supraclavicular, and axillary nodes normal. No abnormal inguinal nodes palpated Neurologic: Grossly normal   Pelvic: External genitalia:  no lesions, normal female              Urethra:  normal appearing urethra with no masses, tenderness or lesions              Bartholin's and Skene's: normal                 Vagina: normal appearing vagina with normal color and discharge, no lesions              Cervix: no cervical motion tenderness, no lesions and normal appearance              Pap taken: Yes.   Bimanual Exam:  Uterus:  normal size, contour, position, consistency, mobility, non-tender              Adnexa: normal adnexa and no mass, fullness, tenderness               Rectovaginal: Confirms               Anus:  normal sphincter tone, no lesions  Chaperone present: yes  A:  Well Woman with normal exam  Menopausal no HRT  Connective tissue disease with auto immune disorder with Management at [facility], now on Humira  Osteoporosis follow up at [facility] on Forteo  Osteo arthritis with Rheumatology management  Incomplete bowel prep colonoscopy not successful  P:   Reviewed health and wellness pertinent to exam  Aware of need to advise if vaginal bleeding  Discussed vaginal dryness management with short term Estrogen, patient not sure she would use. Prefer to continue with OTC moisture.  Continue follow up with MD for health problems as indicated.  Discussed Cologard use, given pamphlet with information. Patient will check with insurance to see  if coverage and may consider. Will not try colonoscopy again.  Pap smear: yes   counseled on breast self exam, mammography screening, feminine hygiene, adequate intake of calcium and vitamin D, diet and exercise  return annually or prn  An After Visit Summary was printed and given to the patient.

## 2016-11-28 LAB — CYTOLOGY - PAP: Diagnosis: NEGATIVE

## 2016-12-22 ENCOUNTER — Telehealth: Payer: Self-pay | Admitting: Certified Nurse Midwife

## 2016-12-22 NOTE — Telephone Encounter (Signed)
Patient calling to speak with nurse about having a cologuard test ordered.

## 2016-12-22 NOTE — Telephone Encounter (Signed)
Spoke with patient. Patient states that she would like to proceed with Cologuard testing at this time. States she spoke with her insurance company, and she will have to pay out of pocket, but would still like to proceed. RN advised would fill paper work out for [employer], CNM to review and sign for fax. Advised patient would return call once paper work had been faxed.   Paper work filled out and to [employer], CNM for signature.   Routing to provider for final review. Patient agreeable to disposition. Will close encounter.

## 2017-01-22 ENCOUNTER — Other Ambulatory Visit: Payer: Self-pay | Admitting: *Deleted

## 2017-01-22 ENCOUNTER — Telehealth: Payer: Self-pay | Admitting: *Deleted

## 2017-01-22 DIAGNOSIS — Z1211 Encounter for screening for malignant neoplasm of colon: Secondary | ICD-10-CM

## 2017-01-22 LAB — COLOGUARD: COLOGUARD: NEGATIVE

## 2017-01-22 NOTE — Telephone Encounter (Signed)
Call to patient. Patient notified of negative cologuard results and verbalized understanding.   Patient agreeable to disposition. Will close encounter.    

## 2017-02-16 DIAGNOSIS — H04123 Dry eye syndrome of bilateral lacrimal glands: Secondary | ICD-10-CM | POA: Insufficient documentation

## 2017-02-16 DIAGNOSIS — H2513 Age-related nuclear cataract, bilateral: Secondary | ICD-10-CM | POA: Insufficient documentation

## 2017-02-16 DIAGNOSIS — Z79631 Long term (current) use of antimetabolite agent: Secondary | ICD-10-CM | POA: Insufficient documentation

## 2017-02-16 DIAGNOSIS — Z79899 Other long term (current) drug therapy: Secondary | ICD-10-CM | POA: Insufficient documentation

## 2017-02-24 ENCOUNTER — Encounter: Payer: Self-pay | Admitting: Certified Nurse Midwife

## 2017-06-24 DIAGNOSIS — T783XXA Angioneurotic edema, initial encounter: Secondary | ICD-10-CM | POA: Insufficient documentation

## 2017-12-07 ENCOUNTER — Other Ambulatory Visit: Payer: Self-pay | Admitting: Internal Medicine

## 2017-12-07 DIAGNOSIS — Z1231 Encounter for screening mammogram for malignant neoplasm of breast: Secondary | ICD-10-CM

## 2017-12-08 ENCOUNTER — Ambulatory Visit (INDEPENDENT_AMBULATORY_CARE_PROVIDER_SITE_OTHER): Payer: BLUE CROSS/BLUE SHIELD | Admitting: Certified Nurse Midwife

## 2017-12-08 ENCOUNTER — Other Ambulatory Visit: Payer: Self-pay

## 2017-12-08 ENCOUNTER — Encounter: Payer: Self-pay | Admitting: Certified Nurse Midwife

## 2017-12-08 ENCOUNTER — Other Ambulatory Visit (HOSPITAL_COMMUNITY)
Admission: RE | Admit: 2017-12-08 | Discharge: 2017-12-08 | Disposition: A | Discharge status: Home / Self Care | Payer: BLUE CROSS/BLUE SHIELD | Source: Ambulatory Visit | Attending: Obstetrics & Gynecology | Admitting: Obstetrics & Gynecology

## 2017-12-08 VITALS — BP 104/64 | HR 64 | Resp 16 | Ht 62.75 in | Wt 113.0 lb

## 2017-12-08 DIAGNOSIS — Z01419 Encounter for gynecological examination (general) (routine) without abnormal findings: Secondary | ICD-10-CM | POA: Diagnosis not present

## 2017-12-08 DIAGNOSIS — Z124 Encounter for screening for malignant neoplasm of cervix: Secondary | ICD-10-CM

## 2017-12-08 DIAGNOSIS — M810 Age-related osteoporosis without current pathological fracture: Secondary | ICD-10-CM

## 2017-12-08 NOTE — Progress Notes (Signed)
62 y.o. G19P3003 Married  Caucasian Fe here for annual exam. Denies vaginal bleeding, some vaginal dryness, has tried coconut and Olive oil and Replens with no change. Sees PCP every 6 months, sees Rheumatology for medication management of Humira and Methotrexate. Now on Prolia for osteoporosis and doing well. Denies any other health issues today.  No LMP recorded. Patient is postmenopausal.          Sexually active: Yes.    The current method of family planning is post menopausal status.    Exercising: Yes.    walking Smoker:  no  Review of Systems  Constitutional: Negative.   HENT: Negative.   Eyes: Negative.   Respiratory: Negative.   Cardiovascular: Positive for palpitations.  Gastrointestinal: Negative.   Genitourinary:       Loss of sexual interest, pain or bleeding with intercourse  Musculoskeletal: Negative.   Skin: Negative.   Neurological: Negative.   Endo/Heme/Allergies:       Excessive thirst  Psychiatric/Behavioral: Negative.     Health Maintenance: Pap:  11-25-16 neg (autoimmune disorder) History of Abnormal Pap: no MMG:  10-30-16 category c density birads 1;neg Self Breast exams: yes Colonoscopy:  2007 f/u in 2017 no success due to inadequate prep, did cologard 2018-neg per patient BMD:   2019 osteoporosis TDaP:  2017 Shingles: per patient she states she didn't have it, shows a date for it in chart Pneumonia: 2015 Hep C and HIV: HIV neg 2018 Labs: if needed   reports that she has never smoked. She has never used smokeless tobacco. She reports that she does not drink alcohol or use drugs.  Past Medical History:  Diagnosis Date  . Autoimmune disorder (HCC)    pleurisy  . Mixed connective tissue disease (HCC)   . Osteoarthritis   . Osteopenia   . Osteoporosis   . Osteoporosis     Past Surgical History:  Procedure Laterality Date  . TUBAL LIGATION     1983    Current Outpatient Medications  Medication Sig Dispense Refill  . Adalimumab (HUMIRA) 40  MG/0.4ML PSKT Inject into the skin every 14 (fourteen) days.    Marland Kitchen aspirin 81 MG tablet Take 81 mg by mouth daily.    . bimatoprost (LATISSE) 0.03 % ophthalmic solution Place one drop on applicator and apply evenly along the skin of the upper eyelid at base of eyelashes once daily at bedtime; repeat procedure for second eye (use a clean applicator).     . Calcium Citrate (CITRACAL PO) Take 2 tablets daily by mouth.     . Cholecalciferol (VITAMIN D PO) Take 2,000 Int'l Units by mouth daily.     . Cyanocobalamin (VITAMIN B 12 PO) Take by mouth daily.    Marland Kitchen denosumab (PROLIA) 60 MG/ML SOSY injection Inject 60 mg into the skin every 6 (six) months.    . escitalopram (LEXAPRO) 10 MG tablet Take 10 mg by mouth daily.    . folic acid (FOLVITE) 1 MG tablet Take 1 mg by mouth daily.     . hydroxychloroquine (PLAQUENIL) 200 MG tablet Take by mouth daily.    Marland Kitchen LORazepam (ATIVAN) 1 MG tablet Take 0.5 mg at bedtime by mouth.     . methotrexate (RHEUMATREX) 2.5 MG tablet Take by mouth. Take 8 tabs BY MOUTH ONCE A WEEK. 4 TABS IN THE AM & 4 IN THE PM    . Probiotic Product (PROBIOTIC PO) Take by mouth.    . traMADol (ULTRAM) 50 MG tablet Take by mouth.  Takes 4-6    . UNABLE TO FIND Liver supplement     No current facility-administered medications for this visit.     Family History  Problem Relation Age of Onset  . Osteoporosis Mother   . Deep vein thrombosis Mother   . Alzheimer's disease Mother   . Osteoporosis Daughter     ROS:  Pertinent items are noted in HPI.  Otherwise, a comprehensive ROS was negative.  Exam:   BP 104/64   Pulse 64   Resp 16   Ht 5' 2.75" (1.594 m)   Wt 113 lb (51.3 kg)   BMI 20.18 kg/m  Height: 5' 2.75" (159.4 cm) Ht Readings from Last 3 Encounters:  12/08/17 5' 2.75" (1.594 m)  11/25/16 5\' 3"  (1.6 m)  11/14/16 5\' 3"  (1.6 m)    General appearance: alert, cooperative and appears stated age Head: Normocephalic, without obvious abnormality, atraumatic Neck: no  adenopathy, supple, symmetrical, trachea midline and thyroid normal to inspection and palpation Lungs: clear to auscultation bilaterally Breasts: normal appearance, no masses or tenderness, No nipple retraction or dimpling, No nipple discharge or bleeding, No axillary or supraclavicular adenopathy Heart: regular rate and rhythm Abdomen: soft, non-tender; no masses,  no organomegaly Extremities: extremities normal, atraumatic, no cyanosis or edema Skin: Skin color, texture, turgor normal. No rashes or lesions Lymph nodes: Cervical, supraclavicular, and axillary nodes normal. No abnormal inguinal nodes palpated Neurologic: Grossly normal   Pelvic: External genitalia:  no lesions,               Urethra:  normal appearing urethra with no masses, tenderness or lesions              Bartholin's and Skene's: normal                 Vagina: normal appearing vagina with normal color and discharge, no lesions              Cervix: no cervical motion tenderness and no lesions              Pap taken: Yes.   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  Seek family and friends assistance as needed for support.  Pap smear: yes   counseled on breast self exam, mammography screening, feminine hygiene, adequate intake of calcium and vitamin D, diet and exercise, Kegel's exercises  return annually  or prn  An After Visit Summary was printed and given to the patient.

## 2017-12-08 NOTE — Patient Instructions (Signed)

## 2017-12-10 LAB — CYTOLOGY - PAP
Diagnosis: NEGATIVE
HPV: NOT DETECTED

## 2018-01-12 ENCOUNTER — Ambulatory Visit
Admission: RE | Admit: 2018-01-12 | Discharge: 2018-01-12 | Disposition: A | Discharge status: Home / Self Care | Payer: BLUE CROSS/BLUE SHIELD | Source: Ambulatory Visit | Attending: Internal Medicine | Admitting: Internal Medicine

## 2018-01-12 DIAGNOSIS — Z1231 Encounter for screening mammogram for malignant neoplasm of breast: Secondary | ICD-10-CM

## 2018-02-26 DIAGNOSIS — H527 Unspecified disorder of refraction: Secondary | ICD-10-CM | POA: Insufficient documentation

## 2018-12-08 ENCOUNTER — Other Ambulatory Visit: Payer: Self-pay

## 2018-12-10 ENCOUNTER — Other Ambulatory Visit: Payer: Self-pay

## 2018-12-10 ENCOUNTER — Ambulatory Visit (INDEPENDENT_AMBULATORY_CARE_PROVIDER_SITE_OTHER): Payer: Medicare Other | Admitting: Certified Nurse Midwife

## 2018-12-10 ENCOUNTER — Other Ambulatory Visit (HOSPITAL_COMMUNITY)
Admission: RE | Admit: 2018-12-10 | Discharge: 2018-12-10 | Disposition: A | Discharge status: Home / Self Care | Payer: BC Managed Care – PPO | Source: Ambulatory Visit | Attending: Obstetrics & Gynecology | Admitting: Obstetrics & Gynecology

## 2018-12-10 ENCOUNTER — Encounter: Payer: Self-pay | Admitting: Certified Nurse Midwife

## 2018-12-10 VITALS — BP 104/60 | HR 68 | Temp 97.1°F | Resp 16 | Ht 63.0 in | Wt 115.0 lb

## 2018-12-10 DIAGNOSIS — Z01419 Encounter for gynecological examination (general) (routine) without abnormal findings: Secondary | ICD-10-CM | POA: Diagnosis not present

## 2018-12-10 DIAGNOSIS — Z124 Encounter for screening for malignant neoplasm of cervix: Secondary | ICD-10-CM | POA: Insufficient documentation

## 2018-12-10 NOTE — Addendum Note (Signed)
Addended by: Regina Eck on: 12/10/2018 04:30 PM   Modules accepted: Orders

## 2018-12-10 NOTE — Patient Instructions (Signed)

## 2018-12-10 NOTE — Progress Notes (Signed)
63 y.o. G90P3003 Married  Caucasian Fe here for annual exam. Menopausal no symptoms now. Occasional vaginal dryness. Denies vaginal bleeding.  Continues with same medication with  Rheumatology at [facility]. Labs all normal. Caring for mother with Alzheimers. No other health issues today.  No LMP recorded. Patient is postmenopausal.          Sexually active: No.  The current method of family planning is post menopausal status & BTL.    Exercising: No.  exercise Smoker:  no  Review of Systems  Constitutional: Negative.   HENT: Negative.   Eyes: Negative.   Respiratory: Negative.   Cardiovascular: Negative.   Gastrointestinal: Negative.   Genitourinary: Negative.   Musculoskeletal: Negative.   Skin: Negative.   Neurological: Negative.   Endo/Heme/Allergies: Negative.   Psychiatric/Behavioral: Negative.     Health Maintenance: Pap:  11-25-16 neg, 12-08-17 neg HPV HR neg (autoimmune disorder) History of Abnormal Pap: no MMG:  01-12-2018 category c density birads 1:neg Self Breast exams: yes Colonoscopy:  2007 f/u 2017 (no success due to inadequate prep), did cologard 2019 neg  BMD:   2019 osteoporosis TDaP:  2017 Shingles: not done Pneumonia: 2015 Hep C and HIV: HIV neg 2018 Labs: PCP   reports that she has never smoked. She has never used smokeless tobacco. She reports that she does not drink alcohol or use drugs.  Past Medical History:  Diagnosis Date  . Autoimmune disorder (HCC)    pleurisy  . Mixed connective tissue disease ([place])   . Osteoarthritis   . Osteopenia   . Osteoporosis   . Osteoporosis     Past Surgical History:  Procedure Laterality Date  . TUBAL LIGATION     1983    Current Outpatient Medications  Medication Sig Dispense Refill  . Adalimumab (HUMIRA) 40 MG/0.4ML PSKT Inject into the skin every 14 (fourteen) days.    Marland Kitchen aspirin 81 MG tablet Take 81 mg by mouth daily.    . bimatoprost (LATISSE) 0.03 % ophthalmic solution Place one drop on  applicator and apply evenly along the skin of the upper eyelid at base of eyelashes once daily at bedtime; repeat procedure for second eye (use a clean applicator).     . Calcium Citrate (CITRACAL PO) Take 2 tablets daily by mouth.     . Cholecalciferol (VITAMIN D PO) Take 2,000 Int'l Units by mouth daily.     . Cyanocobalamin (VITAMIN B 12 PO) Take by mouth daily.    Marland Kitchen denosumab (PROLIA) 60 MG/ML SOSY injection Inject 60 mg into the skin every 6 (six) months.    . escitalopram (LEXAPRO) 10 MG tablet Take 10 mg by mouth daily.    . folic acid (FOLVITE) 1 MG tablet Take 1 mg by mouth daily.     . hydroxychloroquine (PLAQUENIL) 200 MG tablet Take by mouth daily.    Marland Kitchen LORazepam (ATIVAN) 1 MG tablet Take 0.5 mg at bedtime by mouth.     . methotrexate (RHEUMATREX) 2.5 MG tablet Take by mouth. Take 6 tabs BY MOUTH ONCE A WEEK. 3 TABS IN THE AM & 3 IN THE PM    . Probiotic Product (PROBIOTIC PO) Take by mouth.    . traMADol (ULTRAM) 50 MG tablet Take by mouth. Takes 4-6    . UNABLE TO FIND Liver supplement     No current facility-administered medications for this visit.     Family History  Problem Relation Age of Onset  . Osteoporosis Mother   . Deep vein  thrombosis Mother   . Alzheimer's disease Mother   . Hypertension Mother   . Hyperlipidemia Mother   . Osteoporosis Daughter   . Colon cancer Paternal Grandfather   . Breast cancer Maternal Grandmother        in 80's    ROS:  Pertinent items are noted in HPI.  Otherwise, a comprehensive ROS was negative.  Exam:   BP 104/60   Pulse 68   Temp (!) 97.1 F (36.2 C) (Skin)   Resp 16   Ht 5\' 3"  (1.6 m)   Wt 115 lb (52.2 kg)   BMI 20.37 kg/m  Height: 5\' 3"  (160 cm) Ht Readings from Last 3 Encounters:  12/10/18 5\' 3"  (1.6 m)  12/08/17 5' 2.75" (1.594 m)  11/25/16 5\' 3"  (1.6 m)    General appearance: alert, cooperative and appears stated age Head: Normocephalic, without obvious abnormality, atraumatic Neck: no adenopathy, supple,  symmetrical, trachea midline and thyroid normal to inspection and palpation Lungs: clear to auscultation bilaterally Breasts: normal appearance, no masses or tenderness, No nipple retraction or dimpling, No nipple discharge or bleeding, No axillary or supraclavicular adenopathy Heart: regular rate and rhythm Abdomen: soft, non-tender; no masses,  no organomegaly Extremities: extremities normal, atraumatic, no cyanosis or edema Skin: Skin color, texture, turgor normal. No rashes or lesions Lymph nodes: Cervical, supraclavicular, and axillary nodes normal. No abnormal inguinal nodes palpated Neurologic: Grossly normal   Pelvic: External genitalia:  no lesions              Urethra:  normal appearing urethra with no masses, tenderness or lesions              Bartholin's and Skene's: normal                 Vagina: normal appearing vagina with normal color and discharge, no lesions              Cervix: no cervical motion tenderness, no lesions and normal appearance              Pap taken: Yes.   Bimanual Exam:  Uterus:  normal size, contour, position, consistency, mobility, non-tender              Adnexa: normal adnexa and no mass, fullness, tenderness               Rectovaginal: Confirms               Anus:  normal sphincter tone, no lesions  Chaperone present: yes  A:  Well Woman with normal exam  Post menopausal no HRT.  History of Rheumatoid Arthritis on  Methotrexate and Plaquenil managed at [facility]  P:   Reviewed health and wellness pertinent to exam  Aware of need to advise if vaginal bleeding  Continue follow up with MD's as indicated.  Pap smear: yes   counseled on yearly pap smear, SBE and yearly mammogram, good diet, with calcium and vitamin D, regular exercise, colonoscopy  return annually or prn  An After Visit Summary was printed and given to the patient.

## 2018-12-14 LAB — CYTOLOGY - PAP: Diagnosis: NEGATIVE

## 2019-01-06 ENCOUNTER — Other Ambulatory Visit: Payer: Self-pay | Admitting: Internal Medicine

## 2019-01-06 DIAGNOSIS — Z1231 Encounter for screening mammogram for malignant neoplasm of breast: Secondary | ICD-10-CM

## 2019-01-30 ENCOUNTER — Ambulatory Visit: Payer: Medicare Other | Attending: Internal Medicine

## 2019-01-30 DIAGNOSIS — Z23 Encounter for immunization: Secondary | ICD-10-CM | POA: Insufficient documentation

## 2019-01-30 NOTE — Progress Notes (Signed)
   Covid-19 Vaccination Clinic  Name:  ASHTEN SARNOWSKI    MRN: [id number] DOB: 05-26-1955  01/30/2019  Ms. Edgley was observed post Covid-19 immunization for 15 minutes without incidence. She was provided with Vaccine Information Sheet and instruction to access the V-Safe system.   Ms. Guilfoil was instructed to call 911 with any severe reactions post vaccine: Marland Kitchen Difficulty breathing  . Swelling of your face and throat  . A fast heartbeat  . A bad rash all over your body  . Dizziness and weakness    Immunizations Administered    Name Date Dose VIS Date Route   Pfizer COVID-19 Vaccine 01/30/2019  2:48 PM 0.3 mL 12/17/2018 Intramuscular   Manufacturer: [employer], [employer]   Lot: [id number]   NDC: [address]-5

## 2019-02-18 ENCOUNTER — Ambulatory Visit: Payer: BC Managed Care – PPO

## 2019-02-18 ENCOUNTER — Ambulatory Visit: Payer: Medicare Other | Attending: Internal Medicine

## 2019-02-18 DIAGNOSIS — Z23 Encounter for immunization: Secondary | ICD-10-CM | POA: Insufficient documentation

## 2019-02-18 NOTE — Progress Notes (Signed)
   Covid-19 Vaccination Clinic  Name:  Patricia Oconnell    MRN: [id number] DOB: [birth year]  02/18/2019  [name] was observed post Covid-19 immunization for 15 minutes without incidence. She was provided with Vaccine Information Sheet and instruction to access the V-Safe system.   [name] was instructed to call 911 with any severe reactions post vaccine: Marland Kitchen Difficulty breathing  . Swelling of your face and throat  . A fast heartbeat  . A bad rash all over your body  . Dizziness and weakness    Immunizations Administered    Name Date Dose VIS Date Route   Pfizer COVID-19 Vaccine 02/18/2019 12:05 PM 0.3 mL 12/17/2018 Intramuscular   Manufacturer: [employer], [employer]   Lot: [id number]   NDC: [address]-5

## 2019-03-03 DIAGNOSIS — H43821 Vitreomacular adhesion, right eye: Secondary | ICD-10-CM | POA: Insufficient documentation

## 2019-03-08 DIAGNOSIS — Z9181 History of falling: Secondary | ICD-10-CM | POA: Insufficient documentation

## 2019-03-16 ENCOUNTER — Ambulatory Visit
Admission: RE | Admit: 2019-03-16 | Discharge: 2019-03-16 | Disposition: A | Discharge status: Home / Self Care | Payer: Medicare Other | Source: Ambulatory Visit | Attending: Internal Medicine | Admitting: Internal Medicine

## 2019-03-16 ENCOUNTER — Other Ambulatory Visit: Payer: Self-pay

## 2019-03-16 DIAGNOSIS — Z1231 Encounter for screening mammogram for malignant neoplasm of breast: Secondary | ICD-10-CM

## 2019-03-30 ENCOUNTER — Encounter: Payer: Self-pay | Admitting: Certified Nurse Midwife

## 2019-12-14 ENCOUNTER — Ambulatory Visit: Payer: BC Managed Care – PPO | Admitting: Certified Nurse Midwife

## 2020-02-06 ENCOUNTER — Other Ambulatory Visit: Payer: Self-pay | Admitting: Internal Medicine

## 2020-02-06 DIAGNOSIS — Z1231 Encounter for screening mammogram for malignant neoplasm of breast: Secondary | ICD-10-CM

## 2020-03-13 DIAGNOSIS — Z5181 Encounter for therapeutic drug level monitoring: Secondary | ICD-10-CM | POA: Insufficient documentation

## 2020-03-21 ENCOUNTER — Inpatient Hospital Stay: Admission: RE | Admit: 2020-03-21 | Payer: Medicare Other | Source: Ambulatory Visit

## 2020-03-21 ENCOUNTER — Ambulatory Visit: Payer: Medicare Other

## 2020-05-16 ENCOUNTER — Ambulatory Visit
Admission: RE | Admit: 2020-05-16 | Discharge: 2020-05-16 | Disposition: A | Discharge status: Home / Self Care | Payer: Medicare Other | Source: Ambulatory Visit | Attending: Internal Medicine | Admitting: Internal Medicine

## 2020-05-16 ENCOUNTER — Other Ambulatory Visit: Payer: Self-pay

## 2020-05-16 DIAGNOSIS — Z1231 Encounter for screening mammogram for malignant neoplasm of breast: Secondary | ICD-10-CM

## 2020-09-26 ENCOUNTER — Other Ambulatory Visit (HOSPITAL_BASED_OUTPATIENT_CLINIC_OR_DEPARTMENT_OTHER): Payer: Self-pay

## 2020-09-26 ENCOUNTER — Ambulatory Visit: Payer: Medicare Other | Attending: Internal Medicine

## 2020-09-26 DIAGNOSIS — Z23 Encounter for immunization: Secondary | ICD-10-CM

## 2020-09-26 MED ORDER — PFIZER-BIONT COVID-19 VAC-TRIS 30 MCG/0.3ML IM SUSP
INTRAMUSCULAR | 0 refills | Status: DC
  Filled 2020-09-26: qty 0.3, 1d supply, fill #0

## 2020-09-26 NOTE — Progress Notes (Signed)
   Covid-19 Vaccination Clinic  Name:  YARIELIS FUNARO    MRN: [id number] DOB: 02-06-1955  09/26/2020  Ms. Barra was observed post Covid-19 immunization for 15 minutes without incident. She was provided with Vaccine Information Sheet and instruction to access the V-Safe system.   Ms. Volkert was instructed to call 911 with any severe reactions post vaccine: Difficulty breathing  Swelling of face and throat  A fast heartbeat  A bad rash all over body  Dizziness and weakness

## 2020-10-25 LAB — COLOGUARD: COLOGUARD: NEGATIVE

## 2020-12-06 ENCOUNTER — Other Ambulatory Visit (HOSPITAL_BASED_OUTPATIENT_CLINIC_OR_DEPARTMENT_OTHER): Payer: Self-pay

## 2020-12-06 MED ORDER — ZOSTER VAC RECOMB ADJUVANTED 50 MCG/0.5ML IM SUSR
INTRAMUSCULAR | 0 refills | Status: DC
  Filled 2020-12-06: qty 0.5, 1d supply, fill #0

## 2021-03-27 ENCOUNTER — Other Ambulatory Visit (HOSPITAL_BASED_OUTPATIENT_CLINIC_OR_DEPARTMENT_OTHER): Payer: Self-pay

## 2021-03-27 MED ORDER — ZOSTER VAC RECOMB ADJUVANTED 50 MCG/0.5ML IM SUSR
INTRAMUSCULAR | 0 refills | Status: DC
  Filled 2021-03-27: qty 0.5, 1d supply, fill #0

## 2021-06-24 ENCOUNTER — Other Ambulatory Visit: Payer: Self-pay | Admitting: Internal Medicine

## 2021-06-24 DIAGNOSIS — Z1231 Encounter for screening mammogram for malignant neoplasm of breast: Secondary | ICD-10-CM

## 2021-07-08 ENCOUNTER — Ambulatory Visit: Payer: Medicare Other

## 2021-07-23 ENCOUNTER — Ambulatory Visit
Admission: RE | Admit: 2021-07-23 | Discharge: 2021-07-23 | Disposition: A | Discharge status: Home / Self Care | Payer: Medicare Other | Source: Ambulatory Visit | Attending: Internal Medicine | Admitting: Internal Medicine

## 2021-07-23 DIAGNOSIS — Z1231 Encounter for screening mammogram for malignant neoplasm of breast: Secondary | ICD-10-CM

## 2021-10-18 ENCOUNTER — Other Ambulatory Visit (HOSPITAL_BASED_OUTPATIENT_CLINIC_OR_DEPARTMENT_OTHER): Payer: Self-pay

## 2021-10-18 MED ORDER — COVID-19 MRNA 2023-2024 VACCINE (COMIRNATY) 0.3 ML INJECTION
INTRAMUSCULAR | 0 refills | Status: DC
  Filled 2021-10-18: qty 0.3, 1d supply, fill #0

## 2022-01-09 ENCOUNTER — Ambulatory Visit (INDEPENDENT_AMBULATORY_CARE_PROVIDER_SITE_OTHER): Payer: Medicare Other | Admitting: Obstetrics & Gynecology

## 2022-01-09 ENCOUNTER — Other Ambulatory Visit (HOSPITAL_COMMUNITY)
Admission: RE | Admit: 2022-01-09 | Discharge: 2022-01-09 | Disposition: A | Discharge status: Home / Self Care | Payer: Medicare Other | Source: Ambulatory Visit | Attending: Obstetrics & Gynecology | Admitting: Obstetrics & Gynecology

## 2022-01-09 ENCOUNTER — Encounter (HOSPITAL_BASED_OUTPATIENT_CLINIC_OR_DEPARTMENT_OTHER): Payer: Self-pay | Admitting: Obstetrics & Gynecology

## 2022-01-09 VITALS — BP 101/60 | HR 62 | Ht 63.0 in | Wt 115.0 lb

## 2022-01-09 DIAGNOSIS — M359 Systemic involvement of connective tissue, unspecified: Secondary | ICD-10-CM | POA: Diagnosis not present

## 2022-01-09 DIAGNOSIS — Z124 Encounter for screening for malignant neoplasm of cervix: Secondary | ICD-10-CM

## 2022-01-09 DIAGNOSIS — M816 Localized osteoporosis [Lequesne]: Secondary | ICD-10-CM

## 2022-01-09 DIAGNOSIS — Z780 Asymptomatic menopausal state: Secondary | ICD-10-CM | POA: Diagnosis not present

## 2022-01-09 NOTE — Progress Notes (Signed)
GYNECOLOGY  VISIT  CC:   new patient/gyn exam/establishing care  HPI: 67 y.o. G49P3003 Married White or Caucasian female here for new patient/gyn exam.  PMP, not on HRT.  Has vaginal dryness.  History reviewed.  Does have osteoporosis.  Is receiving prolia.  Managed by ortho clinic at [place].    Biggest medical issue is RA.  She is on Humira, MTX and plaquenil.  Does take folic acid as well.  Rheumatologist is at [facility].  Denies vaginal bleeding.  Last pap 2020.  Last HR HPV was 2019.  Use to see Melvia Heaps, CNM.   Past Medical History:  Diagnosis Date   Autoimmune disorder ([place])    pleurisy   Mixed connective tissue disease ([place])    Osteoarthritis    Osteopenia    Osteoporosis    Osteoporosis     MEDS:   Current Outpatient Medications on File Prior to Visit  Medication Sig Dispense Refill   Adalimumab (HUMIRA) 40 MG/0.4ML PSKT Inject into the skin every 14 (fourteen) days.     bimatoprost (LATISSE) 0.03 % ophthalmic solution Place one drop on applicator and apply evenly along the skin of the upper eyelid at base of eyelashes once daily at bedtime; repeat procedure for second eye (use a clean applicator).      Calcium Citrate (CITRACAL PO) Take 2 tablets daily by mouth.      Cholecalciferol (VITAMIN D PO) Take 2,000 Int'l Units by mouth daily.      COVID-19 mRNA Vac-TriS, Pfizer, (PFIZER-BIONT COVID-19 VAC-TRIS) SUSP injection Inject into the muscle. 0.3 mL 0   COVID-19 mRNA vaccine 2023-2024 (COMIRNATY) SUSP injection Inject into the muscle. 0.3 mL 0   Cyanocobalamin (VITAMIN B 12 PO) Take by mouth daily.     denosumab (PROLIA) 60 MG/ML SOSY injection Inject 60 mg into the skin every 6 (six) months.     escitalopram (LEXAPRO) 10 MG tablet Take 10 mg by mouth daily.     folic acid (FOLVITE) 1 MG tablet Take 1 mg by mouth daily.      hydroxychloroquine (PLAQUENIL) 200 MG tablet Take by mouth daily.     LORazepam (ATIVAN) 1 MG tablet Take 0.5 mg at bedtime by mouth.       methotrexate (RHEUMATREX) 2.5 MG tablet Take by mouth. Take 6 tabs BY MOUTH ONCE A WEEK. 3 TABS IN THE AM & 3 IN THE PM     traMADol (ULTRAM) 50 MG tablet Take by mouth. Takes 4-6     Zoster Vaccine Adjuvanted [facility]) injection Inject into the muscle. 0.5 mL 0   Zoster Vaccine Adjuvanted [facility]) injection Inject into the muscle. 0.5 mL 0   No current facility-administered medications on file prior to visit.    ALLERGIES: Azathioprine, Leflunomide, Sulfa antibiotics, and Sulfamethoxazole  SH:  married, non smoker  Review of Systems  All other systems reviewed and are negative.   PHYSICAL EXAMINATION:    BP 101/60 (BP Location: Right Arm, Patient Position: Sitting, Cuff Size: Large)   Pulse 62   Ht 5\' 3"  (1.6 m) Comment: Reported  Wt 115 lb (52.2 kg)   BMI 20.37 kg/m     General appearance: alert, cooperative and appears stated age Neck: no adenopathy, supple, symmetrical, trachea midline and thyroid normal to inspection and palpation CV:  Regular rate and rhythm Lungs:  clear to auscultation, no wheezes, rales or rhonchi, symmetric air entry Breasts: normal appearance, no masses or tenderness Abdomen: soft, non-tender; bowel sounds normal; no masses,  no organomegaly  Lymph:  no inguinal LAD noted  Pelvic: External genitalia:  no lesions              Urethra:  normal appearing urethra with no masses, tenderness or lesions              Bartholins and Skenes: normal                 Vagina: normal appearing vagina with normal color and discharge, no lesions              Cervix: no lesions              Bimanual Exam:  Uterus:  normal size, contour, position, consistency, mobility, non-tender              Adnexa: no mass, fullness, tenderness              Rectovaginal: Yes.  .  Confirms.              Anus:  normal sphincter tone, no lesions  Chaperone, Octaviano Batty, CMA, was present for exam.  Assessment/Plan: 1. Postmenopausal - no HRT - exam normal today  2. Cervical  cancer screening - Cytology - PAP( [place])  3. Localized osteoporosis without current pathological fracture - followed at [facility]  4. Connective tissue disease, undifferentiated ([place]) - followed at [facility]

## 2022-01-10 LAB — CYTOLOGY - PAP: Diagnosis: NEGATIVE

## 2022-01-12 ENCOUNTER — Encounter (HOSPITAL_BASED_OUTPATIENT_CLINIC_OR_DEPARTMENT_OTHER): Payer: Self-pay | Admitting: Obstetrics & Gynecology

## 2022-01-14 ENCOUNTER — Other Ambulatory Visit (HOSPITAL_BASED_OUTPATIENT_CLINIC_OR_DEPARTMENT_OTHER): Payer: Self-pay

## 2022-01-14 MED ORDER — AREXVY 120 MCG/0.5ML IM SUSR
INTRAMUSCULAR | 0 refills | Status: DC
  Filled 2022-01-14: qty 0.5, 1d supply, fill #0

## 2022-07-16 ENCOUNTER — Other Ambulatory Visit: Payer: Self-pay | Admitting: Internal Medicine

## 2022-07-16 DIAGNOSIS — Z1231 Encounter for screening mammogram for malignant neoplasm of breast: Secondary | ICD-10-CM

## 2022-08-07 ENCOUNTER — Ambulatory Visit: Payer: Medicare Other

## 2022-08-13 IMAGING — MG MM DIGITAL SCREENING BILAT W/ TOMO AND CAD
8 series · 9 of 24 positions shown · non-contrast
Comparison: Previous exam(s).

CLINICAL DATA: Screening.

EXAM:
DIGITAL SCREENING BILATERAL MAMMOGRAM WITH TOMOSYNTHESIS AND CAD
TECHNIQUE: Bilateral screening digital craniocaudal and mediolateral oblique
mammograms were obtained. Bilateral screening digital breast
tomosynthesis was performed. The images were evaluated with
computer-aided detection.

[R MLO synth-2D]
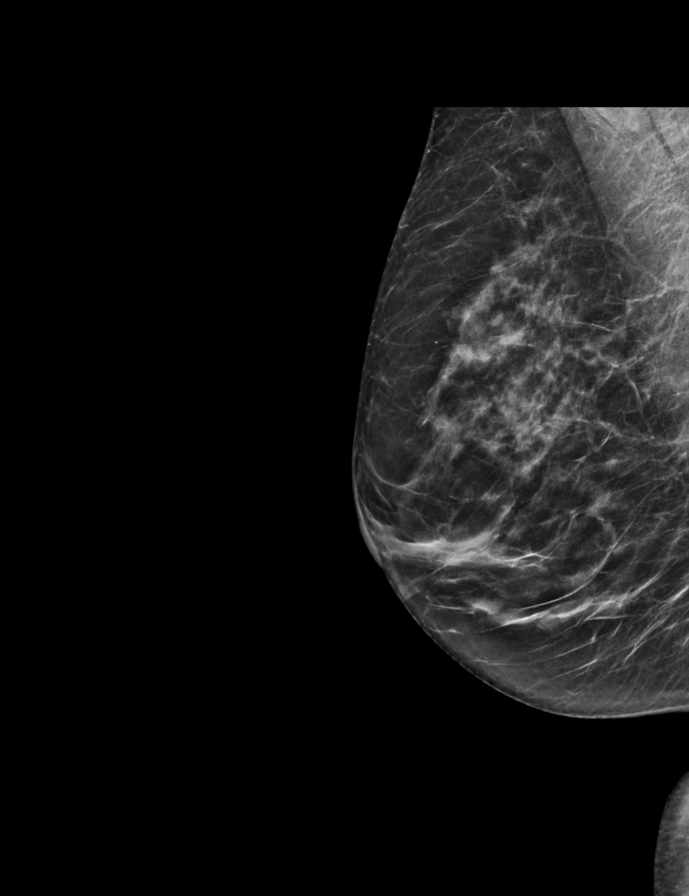

[R CC synth-2D]
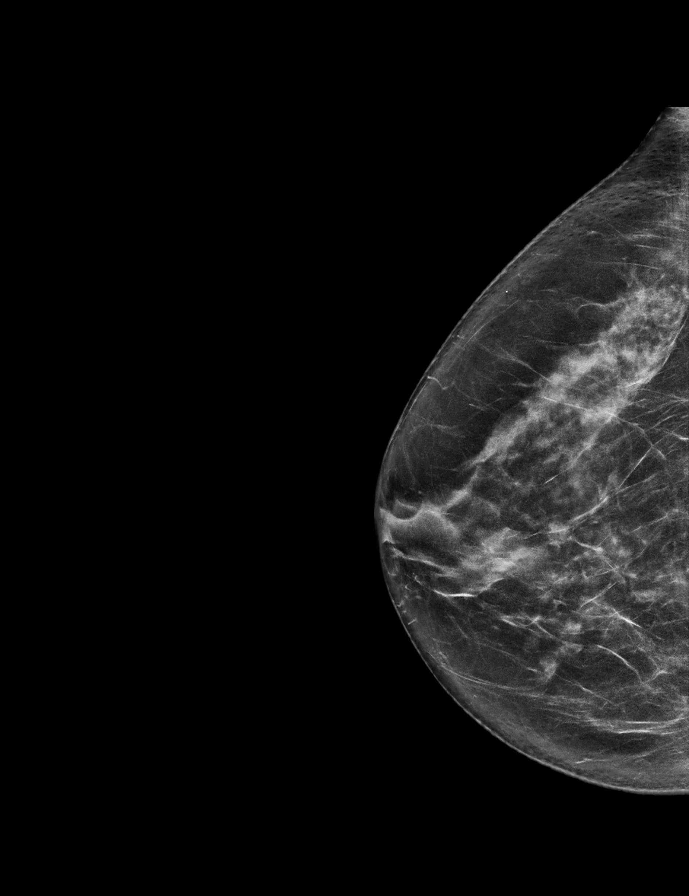

[L MLO synth-2D]
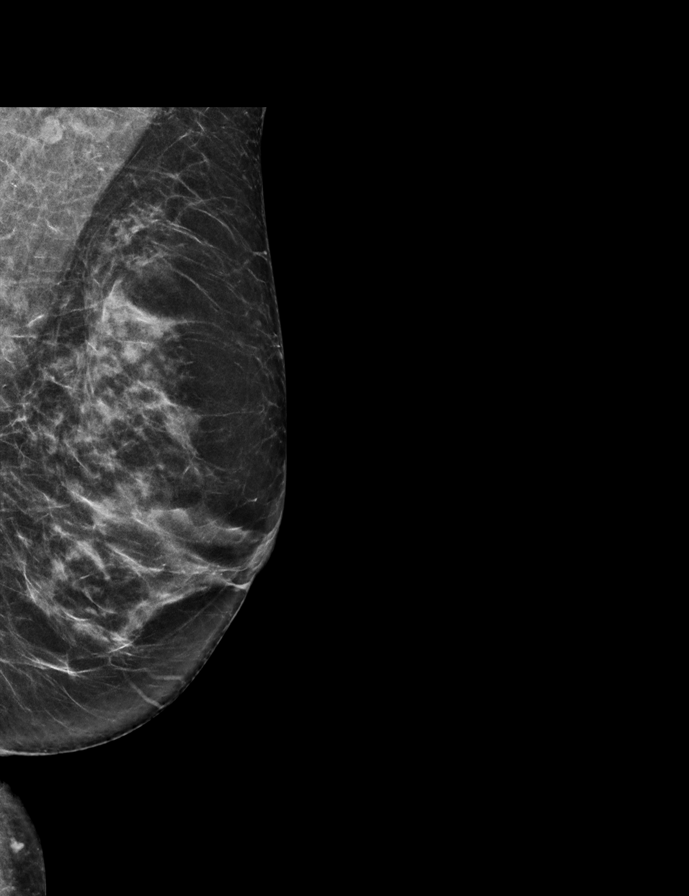

[L CC synth-2D]
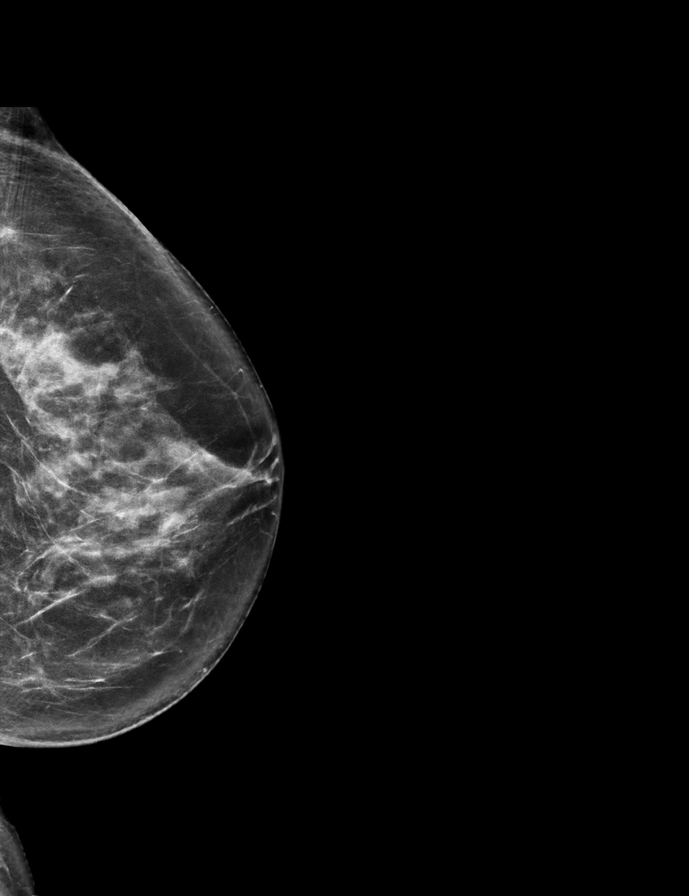

[L MLO tomo · 2 of 57 frames shown]
[frame 19/57]
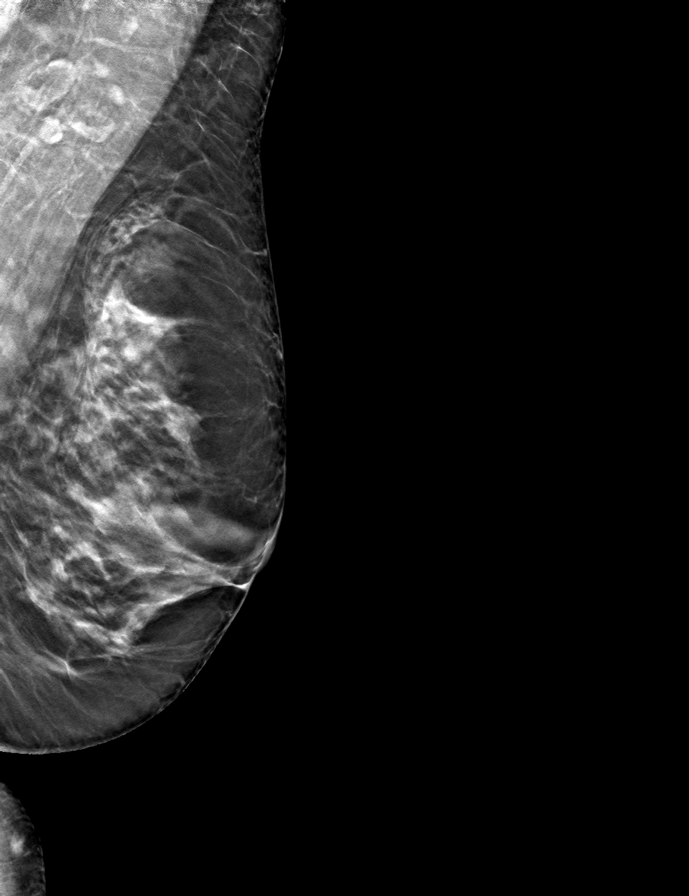
[frame 29/57]
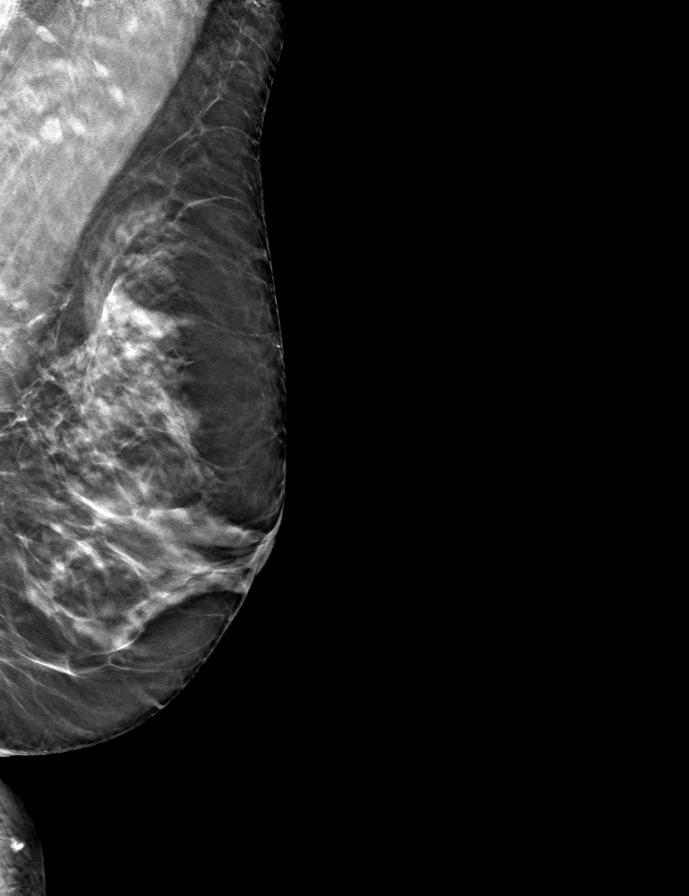

[R CC tomo · tomo slice 33/64.0]
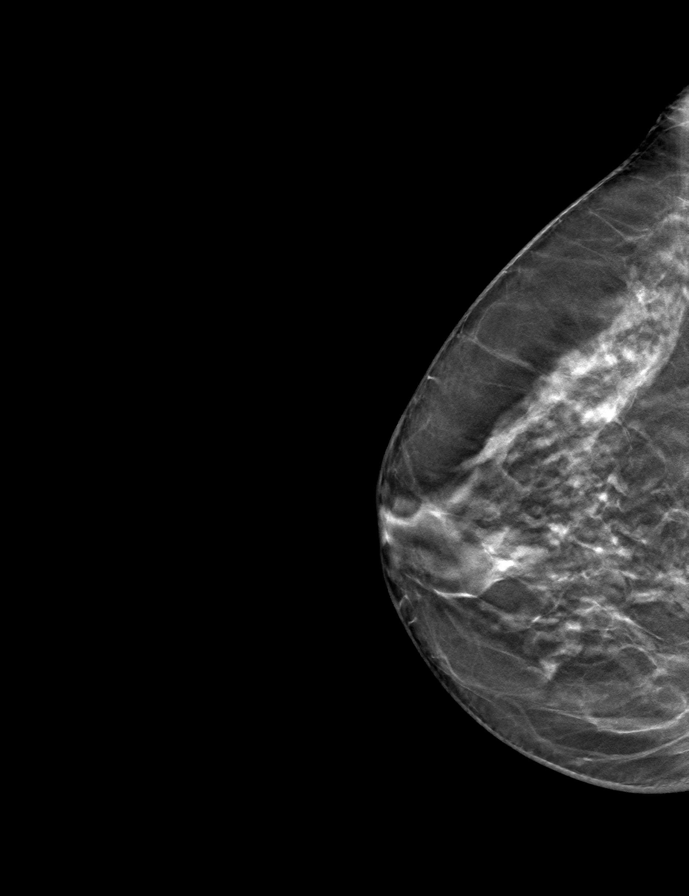

[L CC tomo · tomo slice 33/66.0]
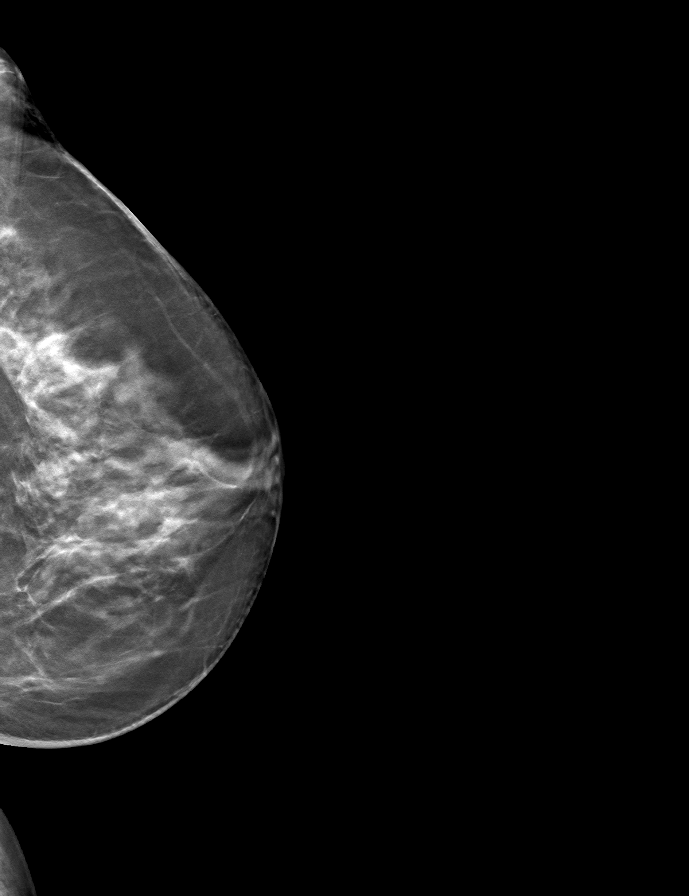

[R MLO tomo · tomo slice 29/56.0]
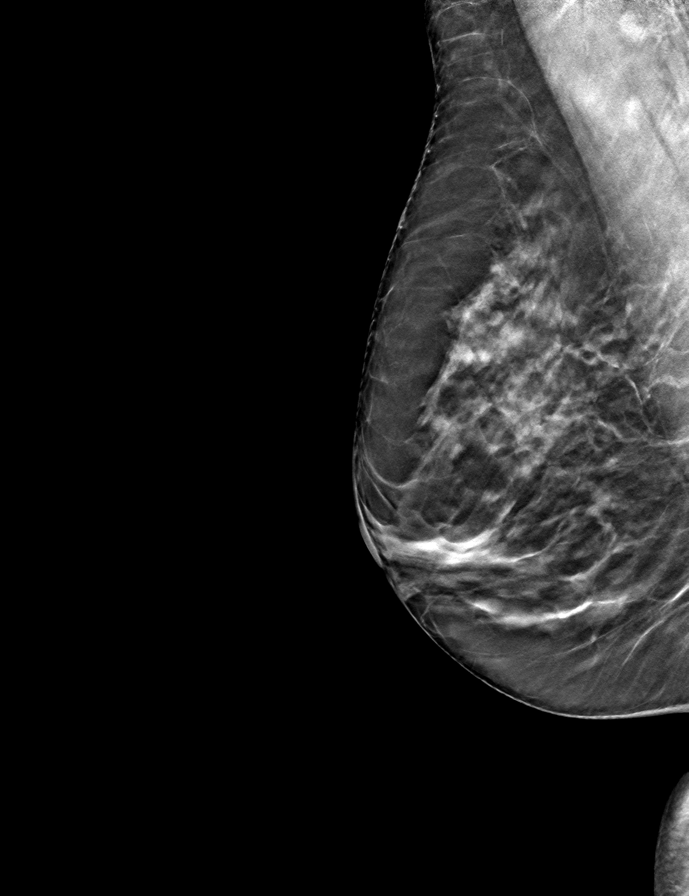

[9 of 24 positions shown; findings below may reference images not displayed]

ACR Breast Density Category c: The breast tissue is heterogeneously
dense, which may obscure small masses.
FINDINGS: There are no findings suspicious for malignancy. The images were
evaluated with computer-aided detection.
IMPRESSION: No mammographic evidence of malignancy. A result letter of this
screening mammogram will be mailed directly to the patient.

RECOMMENDATION:
Screening mammogram in one year. (Code:T4-5-GWO)

BI-RADS CATEGORY  1: Negative.

## 2022-08-20 ENCOUNTER — Ambulatory Visit
Admission: RE | Admit: 2022-08-20 | Discharge: 2022-08-20 | Disposition: A | Discharge status: Home / Self Care | Payer: Medicare Other | Source: Ambulatory Visit | Attending: Internal Medicine | Admitting: Internal Medicine

## 2022-08-20 DIAGNOSIS — Z1231 Encounter for screening mammogram for malignant neoplasm of breast: Secondary | ICD-10-CM

## 2022-09-15 ENCOUNTER — Other Ambulatory Visit (HOSPITAL_BASED_OUTPATIENT_CLINIC_OR_DEPARTMENT_OTHER): Payer: Self-pay

## 2022-09-15 MED ORDER — COMIRNATY 30 MCG/0.3ML IM SUSY
0.3000 mL | PREFILLED_SYRINGE | Freq: Once | INTRAMUSCULAR | 0 refills | Status: AC
  Filled 2022-09-15: qty 0.3, 1d supply, fill #0

## 2022-10-08 ENCOUNTER — Other Ambulatory Visit (HOSPITAL_BASED_OUTPATIENT_CLINIC_OR_DEPARTMENT_OTHER): Payer: Self-pay

## 2022-10-08 MED ORDER — FLUAD 0.5 ML IM SUSY
0.5000 mL | PREFILLED_SYRINGE | Freq: Once | INTRAMUSCULAR | 0 refills | Status: AC
  Filled 2022-10-08: qty 0.5, 1d supply, fill #0

## 2023-03-12 ENCOUNTER — Other Ambulatory Visit: Payer: Self-pay

## 2023-03-12 ENCOUNTER — Ambulatory Visit

## 2023-03-12 ENCOUNTER — Ambulatory Visit: Admitting: Podiatry

## 2023-03-12 ENCOUNTER — Encounter: Payer: Self-pay | Admitting: Podiatry

## 2023-03-12 DIAGNOSIS — M21611 Bunion of right foot: Secondary | ICD-10-CM | POA: Diagnosis not present

## 2023-03-12 DIAGNOSIS — M79671 Pain in right foot: Secondary | ICD-10-CM | POA: Diagnosis not present

## 2023-03-12 DIAGNOSIS — M722 Plantar fascial fibromatosis: Secondary | ICD-10-CM | POA: Diagnosis not present

## 2023-03-12 DIAGNOSIS — M79672 Pain in left foot: Secondary | ICD-10-CM

## 2023-03-12 DIAGNOSIS — M359 Systemic involvement of connective tissue, unspecified: Secondary | ICD-10-CM

## 2023-03-12 MED ORDER — MELOXICAM 15 MG PO TABS: 15.0000 mg | ORAL_TABLET | Freq: Every day | ORAL | 0 refills | Status: DC

## 2023-03-12 NOTE — Patient Instructions (Signed)

## 2023-03-12 NOTE — Progress Notes (Signed)
  Subjective:  Patient ID: Patricia Oconnell, female    DOB: 02/06/1955,   MRN: [id number]  No chief complaint on file.   68 y.o. female presents for concern of bilateral foot pain that has been ongoing for a while. She has a history of mixed connective tissue disorder and RA. She relates she does get pain in her feet all of the time but has been acting up in her heels and first steps after rest have been painful. Denies much treatment and here to discuss preventative measures.   . Denies any other pedal complaints. Denies n/v/f/c.   Past Medical History:  Diagnosis Date   Autoimmune disorder (HCC)    pleurisy   Mixed connective tissue disease (HCC)    Osteoarthritis    Osteoporosis     Objective:  Physical Exam: Vascular: DP/PT pulses 2/4 bilateral. CFT <3 seconds. Normal hair growth on digits. No edema.  Skin. No lacerations or abrasions bilateral feet.  Musculoskeletal: MMT 5/5 bilateral lower extremities in DF, PF, Inversion and Eversion. Deceased ROM in DF of ankle joint. 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 Milldy tender to the medial calcaneal tubercle bilaterally . No pain with achilles, PT or arch. No pain with calcaneal squeeze.  Neurological: Sensation intact to light touch.   Assessment:   1. Plantar fasciitis, bilateral   2. Bunion of great toe of right foot   3. Connective tissue disease, undifferentiated (HCC)      Plan:  Patient was evaluated and treated and all questions answered. Discussed plantar fasciitis with patient.  X-rays reviewed and discussed with patient. No acute fractures or dislocations noted. Mild spurring noted at inferior calcaneus. Mild HAV deformity noted.  Discussed treatment options including, ice, NSAIDS, supportive shoes, bracing, and stretching. Stretching exercises provided to be done on a daily basis.   Prescription for meloxicam provided and sent to pharmacy. Discussed foot deformities and inserts. Powersteps dispensed.  Follow-up 6  weeks or sooner if any problems arise. In the meantime, encouraged to call the office with any questions, concerns, change in symptoms.      Louann Sjogren, DPM

## 2023-05-05 DIAGNOSIS — Z6820 Body mass index (BMI) 20.0-20.9, adult: Secondary | ICD-10-CM | POA: Insufficient documentation

## 2023-08-06 DIAGNOSIS — R42 Dizziness and giddiness: Secondary | ICD-10-CM | POA: Insufficient documentation

## 2023-09-28 ENCOUNTER — Other Ambulatory Visit (HOSPITAL_BASED_OUTPATIENT_CLINIC_OR_DEPARTMENT_OTHER): Payer: Self-pay

## 2023-09-28 MED ORDER — COMIRNATY 30 MCG/0.3ML IM SUSY
0.3000 mL | PREFILLED_SYRINGE | Freq: Once | INTRAMUSCULAR | 0 refills | Status: AC
  Filled 2023-09-28: qty 0.3, 1d supply, fill #0

## 2023-10-02 DIAGNOSIS — H6123 Impacted cerumen, bilateral: Secondary | ICD-10-CM | POA: Insufficient documentation

## 2023-10-02 DIAGNOSIS — H903 Sensorineural hearing loss, bilateral: Secondary | ICD-10-CM | POA: Insufficient documentation

## 2023-11-02 ENCOUNTER — Other Ambulatory Visit: Payer: Self-pay | Admitting: Internal Medicine

## 2023-11-02 DIAGNOSIS — Z1231 Encounter for screening mammogram for malignant neoplasm of breast: Secondary | ICD-10-CM

## 2023-11-18 ENCOUNTER — Ambulatory Visit
Admission: RE | Admit: 2023-11-18 | Discharge: 2023-11-18 | Disposition: A | Discharge status: Home / Self Care | Source: Ambulatory Visit | Attending: Internal Medicine | Admitting: Internal Medicine

## 2023-11-18 DIAGNOSIS — Z1231 Encounter for screening mammogram for malignant neoplasm of breast: Secondary | ICD-10-CM

## 2023-11-24 ENCOUNTER — Ambulatory Visit: Admitting: Family Medicine

## 2023-11-24 ENCOUNTER — Encounter: Payer: Self-pay | Admitting: Family Medicine

## 2023-11-24 VITALS — BP 114/60 | HR 74 | Temp 97.0°F | Ht 63.0 in | Wt 118.1 lb

## 2023-11-24 DIAGNOSIS — L814 Other melanin hyperpigmentation: Secondary | ICD-10-CM | POA: Insufficient documentation

## 2023-11-24 DIAGNOSIS — R5383 Other fatigue: Secondary | ICD-10-CM | POA: Insufficient documentation

## 2023-11-24 DIAGNOSIS — L821 Other seborrheic keratosis: Secondary | ICD-10-CM | POA: Insufficient documentation

## 2023-11-24 DIAGNOSIS — F5101 Primary insomnia: Secondary | ICD-10-CM

## 2023-11-24 DIAGNOSIS — D1800 Hemangioma unspecified site: Secondary | ICD-10-CM | POA: Insufficient documentation

## 2023-11-24 DIAGNOSIS — G43009 Migraine without aura, not intractable, without status migrainosus: Secondary | ICD-10-CM | POA: Insufficient documentation

## 2023-11-24 DIAGNOSIS — R42 Dizziness and giddiness: Secondary | ICD-10-CM

## 2023-11-24 DIAGNOSIS — R252 Cramp and spasm: Secondary | ICD-10-CM

## 2023-11-24 DIAGNOSIS — M359 Systemic involvement of connective tissue, unspecified: Secondary | ICD-10-CM | POA: Diagnosis not present

## 2023-11-24 DIAGNOSIS — D225 Melanocytic nevi of trunk: Secondary | ICD-10-CM | POA: Insufficient documentation

## 2023-11-24 DIAGNOSIS — F324 Major depressive disorder, single episode, in partial remission: Secondary | ICD-10-CM

## 2023-11-24 DIAGNOSIS — C44612 Basal cell carcinoma of skin of right upper limb, including shoulder: Secondary | ICD-10-CM | POA: Insufficient documentation

## 2023-11-24 DIAGNOSIS — M810 Age-related osteoporosis without current pathological fracture: Secondary | ICD-10-CM

## 2023-11-24 DIAGNOSIS — Z85828 Personal history of other malignant neoplasm of skin: Secondary | ICD-10-CM | POA: Insufficient documentation

## 2023-11-24 MED ORDER — HYDROXYZINE PAMOATE 25 MG PO CAPS: 25.0000 mg | ORAL_CAPSULE | Freq: Three times a day (TID) | ORAL | 0 refills | Status: DC | PRN

## 2023-11-24 NOTE — Progress Notes (Signed)
 New Patient Office Visit  Subjective:  Patient ID: Patricia Oconnell, female    DOB: [birth year]  Age: 68 y.o. MRN: [id number]  CC:  Chief Complaint  Patient presents with   Establish Care    Pt is here to establish care    Discussed the use of AI scribe software for clinical note transcription with the patient, who gave verbal consent to proceed.  History of Present Illness Patricia Oconnell is a 68 year old female who presents for establishment of care and management of chronic conditions.  She has a history of rheumatoid arthritis and osteoporosis. She is currently on Humira, plaquenil , and mtx  for rheumatoid arthritis, which has significantly improved her mobility and reduced knee pain. Without Humira, she experiences severe knee pain that affects her ability to walk and shop. She also takes Prolia for osteoporosis.  She experiences sinus headaches, particularly when there are changes in barometric pressure. These headaches have been a lifelong issue and are typically managed with Tylenol , which alleviates the symptoms. Occasionally, she takes Benadryl , which causes drowsiness. No severe migraines or severe allergy problems, only sinus headaches.  She experiences significant fatigue, particularly in the afternoons, which she describes as a 'total drain'. She manages this by drinking tea and taking breaks. She takes vitamin B12 but continues to experience fatigue.  She has difficulty sleeping without lorazepam , which she has been taking for a few years. She takes 0.5 mg of lorazepam  to help her relax and fall asleep. She is concerned about potential memory issues associated with long-term use.  She experiences dizziness and reports that her blood pressure is typically around 110/50 mmHg. She has a history of low blood pressure and occasional dizzy spells. She does not use compression stockings and drinks a significant amount of water daily.  She experiences muscle cramps, particularly in  her calves and toes, which she attributes to possible dehydration. She drinks 6-8 glasses of water daily but does not regularly use electrolytes. She sees a trainer twice a week and performs calf raises and stretches to alleviate cramps.  She takes Lexapro  for anxiety and depression, which she takes in the morning. She denies any thoughts of suicide and reports that Lexapro  manages her symptoms well.     Current Outpatient Medications:    Adalimumab (HUMIRA) 40 MG/0.4ML PSKT, Inject into the skin every 14 (fourteen) days., Disp: , Rfl:    Calcium Citrate (CITRACAL PO), Take 2 tablets daily by mouth. , Disp: , Rfl:    Cholecalciferol (VITAMIN D PO), Take 2,000 Int'l Units by mouth daily. , Disp: , Rfl:    Cyanocobalamin (VITAMIN B 12 PO), Take by mouth daily., Disp: , Rfl:    denosumab (PROLIA) 60 MG/ML SOSY injection, Inject 60 mg into the skin every 6 (six) months., Disp: , Rfl:    escitalopram  (LEXAPRO ) 10 MG tablet, Take 10 mg by mouth daily., Disp: , Rfl:    estradiol (ESTRACE) 0.01 % CREA vaginal cream, Place 0.5 g vaginally., Disp: , Rfl:    folic acid  (FOLVITE ) 1 MG tablet, Take 1 mg by mouth daily. , Disp: , Rfl:    hydroxychloroquine  (PLAQUENIL ) 200 MG tablet, Take by mouth daily., Disp: , Rfl:    hydrOXYzine (VISTARIL) 25 MG capsule, Take 1 capsule (25 mg total) by mouth every 8 (eight) hours as needed., Disp: 10 capsule, Rfl: 0   LORazepam  (ATIVAN ) 1 MG tablet, Take 0.5 mg at bedtime by mouth. , Disp: , Rfl:    methotrexate (  RHEUMATREX) 2.5 MG tablet, Take by mouth. Take 6 tabs BY MOUTH ONCE A WEEK. 3 TABS IN THE AM & 3 IN THE PM, Disp: , Rfl:   Past Medical History:  Diagnosis Date   Autoimmune disorder    pleurisy   Mixed connective tissue disease    Osteoarthritis    Osteoporosis    Rheumatoid arthritis (HCC)     Past Surgical History:  Procedure Laterality Date   TUBAL LIGATION     1983    Family History  Problem Relation Age of Onset   Osteoporosis Mother     Deep vein thrombosis Mother    Alzheimer's disease Mother        Lewey body   Hypertension Mother    Hyperlipidemia Mother    Hypertension Father    Osteoporosis Daughter        osteopenia   Breast cancer Maternal Grandmother        in 15's   Colon cancer Paternal Grandfather 70    Social History   Socioeconomic History   Marital status: Married    Spouse name: Not on file   Number of children: 3   Years of education: Not on file   Highest education level: Some college, no degree  Occupational History   Not on file  Tobacco Use   Smoking status: Never   Smokeless tobacco: Never  Vaping Use   Vaping status: Never Used  Substance and Sexual Activity   Alcohol use: No    Alcohol/week: 0.0 standard drinks of alcohol   Drug use: Never   Sexual activity: Not Currently    Partners: Male    Birth control/protection: Surgical    Comment: BTL  Other Topics Concern   Not on file  Social History Narrative   2 grands   retired   Chief Executive Officer Drivers of Corporate Investment Banker Strain: Low Risk  (11/20/2023)   Overall Financial Resource Strain (CARDIA)    Difficulty of Paying Living Expenses: Not hard at all  Food Insecurity: No Food Insecurity (11/20/2023)   Hunger Vital Sign    Worried About Running Out of Food in the Last Year: Never true    Ran Out of Food in the Last Year: Never true  Transportation Needs: No Transportation Needs (11/20/2023)   PRAPARE - Administrator, Civil Service (Medical): No    Lack of Transportation (Non-Medical): No  Physical Activity: Sufficiently Active (11/20/2023)   Exercise Vital Sign    Days of Exercise per Week: 3 days    Minutes of Exercise per Session: 60 min  Stress: No Stress Concern Present (11/20/2023)   [employer] of Occupational Health - Occupational Stress Questionnaire    Feeling of Stress: Not at all  Social Connections: Moderately Integrated (11/20/2023)   Social Connection and Isolation Panel     Frequency of Communication with Friends and Family: Twice a week    Frequency of Social Gatherings with Friends and Family: Once a week    Attends Religious Services: Never    Database Administrator or Organizations: Yes    Attends Banker Meetings: 1 to 4 times per year    Marital Status: Married  Catering Manager Violence: Unknown (04/10/2021)   Received from [facility]   HITS    Physically Hurt: Not on file    Insult or Talk Down To: Not on file    Threaten Physical Harm: Not on file    Scream or Curse: Not  on file    ROS  ROS: Gen: no fever, chills  Skin: no rash, itching ENT: no ear pain, ear drainage, nasal congestion, rhinorrhea, sinus pressure, sore throat.  Occ allergies Eyes: no blurry vision, double vision Resp: no cough, wheeze,SOB CV: no CP, palpitations, LE edema,  GI: no heartburn, n/v/d/c, abd pain GU: no dysuria, urgency, frequency, hematuria MSK: HPI Neuro: HPI Psych:HPI   Objective:   Today's Vitals: BP 114/60 (BP Location: Left Arm, Patient Position: Sitting, Cuff Size: Normal)   Pulse 74   Temp (!) 97 F (36.1 C) (Temporal)   Ht 5' 3 (1.6 m)   Wt 118 lb 2 oz (53.6 kg)   SpO2 98%   BMI 20.92 kg/m   Physical Exam  Gen: WDWN NAD HEENT: NCAT, conjunctiva not injected, sclera nonicteric NECK:  supple, no thyromegaly, no nodes, no carotid bruits CARDIAC: RRR, S1S2+, no murmur. DP 2+B LUNGS: CTAB. No wheezes ABDOMEN:  BS+, soft, NTND, No HSM, no masses EXT:  no edema MSK: no gross abnormalities.  NEURO: A&O x3.  CN II-XII intact.  PSYCH: normal mood. Good eye contact   Assessment & Plan:  Undifferentiated connective tissue disease  Other fatigue  Dizziness  Migraine without aura and without status migrainosus, not intractable  Muscle cramps  Age-related osteoporosis without current pathological fracture  Primary insomnia -     hydrOXYzine Pamoate; Take 1 capsule (25 mg total) by mouth every 8 (eight) hours as needed.   Dispense: 10 capsule; Refill: 0  Major depressive disorder with single episode, in partial remission    Assessment and Plan Assessment & Plan  Primary Insomnia   Chronic insomnia is managed with lorazepam , but there are concerns about memory loss and side effects. A trial of hydroxyzine is prescribed as an alternative. If effective, consider gradually weaning off lorazepam . Monitor for side effects such as rash or adverse reactions. Mirtazapine may be considered if hydroxyzine is ineffective.  Rheumatoid Arthritis and Osteoarthritis   Rheumatoid arthritis with osteoarthritis symptoms is managed with Humira, plaquenil , mtx which significantly improves symptoms and quality of life. Managed by rheum at [facility]  Osteoporosis   Osteoporosis is managed with Prolia. Continue Prolia for management.  Depression and Anxiety Disorder   Depression and anxiety are managed with Lexapro . No suicidal ideation is reported. Continue Lexapro  for management.  Fatigue   Chronic fatigue, particularly in the afternoon, may be due to migraines and electrolyte imbalance. Add electrolytes to daily water intake and consider magnesium glycinate 500 mg at night to aid sleep and potentially reduce fatigue.  Migraine (Sinus Headache Variant)   Sinus headaches triggered by barometric pressure changes are managed with Tylenol  and occasional Benadryl . Consider adding caffeine to Tylenol  for headache relief.  Dizziness   Intermittent dizziness may be related to low blood pressure. Wear compression stockings during the day and add electrolytes to daily water intake.  Muscle Cramps   Intermittent muscle cramps may be related to dehydration or electrolyte imbalance. Add electrolytes to daily water intake and consider magnesium glycinate 500 mg at night to aid with cramps.  Urinary Incontinence   Urinary incontinence is managed with vaginal cream prescribed by a GYN urologist. There are concerns about potential irritation  from athletic wear and pads. Continue using the prescribed vaginal cream and ensure proper hygiene by changing clothes after workouts.      Follow-up: Return in about 4 months (around 03/23/2024) for annual physical.   Jenkins CHRISTELLA Carrel, MD

## 2023-11-24 NOTE — Patient Instructions (Signed)
 Welcome to [facility]! It was a pleasure meeting you today.  As discussed, Please schedule a 4 month follow up visit today.  Compression stockings,  electrolytes to one water daily, magnesium glycinate 500mg  at night  Caffeine may help headache as well.  PLEASE NOTE:  If you had any LAB tests please let us  know if you have not heard back within a few days. You may see your results on MyChart before we have a chance to review them but we will give you a call once they are reviewed by us . If we ordered any REFERRALS today, please let us  know if you have not heard from their office within the next week.  Let us  know through MyChart if you are needing REFILLS, or have your pharmacy send us  the request. You can also use MyChart to communicate with me or any office staff.  Please try these tips to maintain a healthy lifestyle:  Eat most of your calories during the day when you are active. Eliminate processed foods including packaged sweets (pies, cakes, cookies), reduce intake of potatoes, white bread, white pasta, and white rice. Look for whole grain options, oat flour or almond flour.  Each meal should contain half fruits/vegetables, one quarter protein, and one quarter carbs (no bigger than a computer mouse).  Cut down on sweet beverages. This includes juice, soda, and sweet tea. Also watch fruit intake, though this is a healthier sweet option, it still contains natural sugar! Limit to 3 servings daily.  Drink at least 1 glass of water with each meal and aim for at least 8 glasses per day  Exercise at least 150 minutes every week.

## 2023-11-25 ENCOUNTER — Other Ambulatory Visit (HOSPITAL_COMMUNITY): Payer: Self-pay

## 2023-11-25 ENCOUNTER — Telehealth: Payer: Self-pay

## 2023-11-25 NOTE — Telephone Encounter (Signed)
  Please advise  Copied from CRM #[id number]. Topic: Clinical - Medical Advice >> Nov 25, 2023  4:39 PM Shereese L wrote: Reason for CRM: [employer] is calling to adv of the denied prior auth for hydrOXYzine (VISTARIL) 25 MG capsule and stated that a copy of the letter would be faxed

## 2023-11-25 NOTE — Telephone Encounter (Signed)
 Pharmacy Patient Advocate Encounter   Received notification from Onbase that prior authorization for Hydroxyzine  25mg  caps is required/requested.   Insurance verification completed.   The patient is insured through [facility].   Per test claim: PA required; PA submitted to above mentioned insurance via Latent Key/confirmation #/EOC [id number] Status is pending

## 2023-11-26 NOTE — Telephone Encounter (Signed)
 Pharmacy Patient Advocate Encounter  Received notification from [facility] that Prior Authorization for Hydroxyzine 25MG  CAPS has been DENIED.  Full denial letter will be uploaded to the media tab. See denial reason below.   PA #/Case ID/Reference #: [id number]

## 2023-11-26 NOTE — Telephone Encounter (Signed)
 Working from previous note from pt smk

## 2023-12-09 ENCOUNTER — Ambulatory Visit (INDEPENDENT_AMBULATORY_CARE_PROVIDER_SITE_OTHER)

## 2023-12-09 ENCOUNTER — Ambulatory Visit (HOSPITAL_BASED_OUTPATIENT_CLINIC_OR_DEPARTMENT_OTHER): Admitting: Orthopaedic Surgery

## 2023-12-09 DIAGNOSIS — M25552 Pain in left hip: Secondary | ICD-10-CM | POA: Diagnosis not present

## 2023-12-09 NOTE — Progress Notes (Signed)
 Chief Complaint: Left hip pain     History of Present Illness:    Patricia Oconnell is a 68 y.o. female presents today with ongoing left hip pain for 2 to 3 months when she felt a shifting in the left posterior buttock area.  She is very active and does see a systems analyst here at [facility] well.  She has been doing physical therapy without significant change.  She has been having tenderness since this time    PMH/PSH/Family History/Social History/Meds/Allergies:    Past Medical History:  Diagnosis Date   Autoimmune disorder    pleurisy   Mixed connective tissue disease    Osteoarthritis    Osteoporosis    Rheumatoid arthritis (HCC)    Past Surgical History:  Procedure Laterality Date   TUBAL LIGATION     1983   Social History   Socioeconomic History   Marital status: Married    Spouse name: Not on file   Number of children: 3   Years of education: Not on file   Highest education level: Some college, no degree  Occupational History   Not on file  Tobacco Use   Smoking status: Never   Smokeless tobacco: Never  Vaping Use   Vaping status: Never Used  Substance and Sexual Activity   Alcohol use: No    Alcohol/week: 0.0 standard drinks of alcohol   Drug use: Never   Sexual activity: Not Currently    Partners: Male    Birth control/protection: Surgical    Comment: BTL  Other Topics Concern   Not on file  Social History Narrative   2 grands   retired   Chief Executive Officer Drivers of Corporate Investment Banker Strain: Low Risk  (11/20/2023)   Overall Financial Resource Strain (CARDIA)    Difficulty of Paying Living Expenses: Not hard at all  Food Insecurity: No Food Insecurity (11/20/2023)   Hunger Vital Sign    Worried About Running Out of Food in the Last Year: Never true    Ran Out of Food in the Last Year: Never true  Transportation Needs: No Transportation Needs (11/20/2023)   PRAPARE - Administrator, Civil Service (Medical): No    Lack of  Transportation (Non-Medical): No  Physical Activity: Sufficiently Active (11/20/2023)   Exercise Vital Sign    Days of Exercise per Week: 3 days    Minutes of Exercise per Session: 60 min  Stress: No Stress Concern Present (11/20/2023)   [employer] of Occupational Health - Occupational Stress Questionnaire    Feeling of Stress: Not at all  Social Connections: Moderately Integrated (11/20/2023)   Social Connection and Isolation Panel    Frequency of Communication with Friends and Family: Twice a week    Frequency of Social Gatherings with Friends and Family: Once a week    Attends Religious Services: Never    Database Administrator or Organizations: Yes    Attends Engineer, Structural: 1 to 4 times per year    Marital Status: Married   Family History  Problem Relation Age of Onset   Osteoporosis Mother    Deep vein thrombosis Mother    Alzheimer's disease Mother        Lewey body   Hypertension Mother    Hyperlipidemia Mother    Hypertension Father    Osteoporosis Daughter        osteopenia   Breast cancer Maternal Grandmother        in  80's   Colon cancer Paternal Grandfather 4   Allergies  Allergen Reactions   Azathioprine Itching   Leflunomide Itching   Sulfa Antibiotics     Unsure of reaction   Sulfamethoxazole Rash    Pt does not remember   Current Outpatient Medications  Medication Sig Dispense Refill   Adalimumab (HUMIRA) 40 MG/0.4ML PSKT Inject into the skin every 14 (fourteen) days.     Calcium Citrate (CITRACAL PO) Take 2 tablets daily by mouth.      Cholecalciferol (VITAMIN D PO) Take 2,000 Int'l Units by mouth daily.      Cyanocobalamin (VITAMIN B 12 PO) Take by mouth daily.     denosumab (PROLIA) 60 MG/ML SOSY injection Inject 60 mg into the skin every 6 (six) months.     escitalopram  (LEXAPRO ) 10 MG tablet Take 10 mg by mouth daily.     estradiol (ESTRACE) 0.01 % CREA vaginal cream Place 0.5 g vaginally.     folic acid  (FOLVITE ) 1 MG  tablet Take 1 mg by mouth daily.      hydroxychloroquine  (PLAQUENIL ) 200 MG tablet Take by mouth daily.     hydrOXYzine  (VISTARIL ) 25 MG capsule Take 1 capsule (25 mg total) by mouth every 8 (eight) hours as needed. 10 capsule 0   LORazepam  (ATIVAN ) 1 MG tablet Take 0.5 mg at bedtime by mouth.      methotrexate (RHEUMATREX) 2.5 MG tablet Take by mouth. Take 6 tabs BY MOUTH ONCE A WEEK. 3 TABS IN THE AM & 3 IN THE PM     No current facility-administered medications for this visit.   No results found.  Review of Systems:   A ROS was performed including pertinent positives and negatives as documented in the HPI.  Physical Exam :   Constitutional: NAD and appears stated age Neurological: Alert and oriented Psych: Appropriate affect and cooperative There were no vitals taken for this visit.   Comprehensive Musculoskeletal Exam:    Left hip with tenderness about the posterior SI joint with positive compression in this area.  Negative FADIR maneuver with 30 degrees internal/external rotation of left hip without pain negative Trendelenburg, negative pain with resisted hamstring curl   Imaging:   Xray (3 views left hip): Normal    I personally reviewed and interpreted the radiographs.   Assessment and Plan:   68 y.o. female with evidence of left SI joint irritation after an injury 2 to 3 months prior.  At this time would like to plan to refer her to Dr. Burnetta for SI ultrasound-guided injection as I do believe this would get her back to her baseline state she will make an appointment with me as needed in 6 to 8 weeks   I personally saw and evaluated the patient, and participated in the management and treatment plan.  [name], MD Attending Physician, Orthopedic Surgery  This document was dictated using Dragon voice recognition software. A reasonable attempt at proof reading has been made to minimize errors.

## 2023-12-15 ENCOUNTER — Other Ambulatory Visit: Payer: Self-pay | Admitting: Family Medicine

## 2023-12-15 DIAGNOSIS — F5101 Primary insomnia: Secondary | ICD-10-CM

## 2023-12-24 ENCOUNTER — Other Ambulatory Visit: Payer: Self-pay

## 2023-12-24 ENCOUNTER — Encounter: Payer: Self-pay | Admitting: Sports Medicine

## 2023-12-24 ENCOUNTER — Ambulatory Visit: Admitting: Sports Medicine

## 2023-12-24 DIAGNOSIS — M7918 Myalgia, other site: Secondary | ICD-10-CM | POA: Diagnosis not present

## 2023-12-24 DIAGNOSIS — M533 Sacrococcygeal disorders, not elsewhere classified: Secondary | ICD-10-CM

## 2023-12-24 DIAGNOSIS — G8929 Other chronic pain: Secondary | ICD-10-CM | POA: Diagnosis not present

## 2023-12-24 NOTE — Progress Notes (Signed)
 Patricia Oconnell - 68 y.o. female MRN [id number]  Date of birth: [birth year]  Office Visit Note: Visit Date: 12/24/2023 PCP: Wendolyn Jenkins Jansky, MD Referred by: Genelle Standing, MD  Subjective: Chief Complaint  Patient presents with   Left Hip - Pain   HPI: Patricia Oconnell is a pleasant 68 y.o. female who presents today for chronic left posterior hip/SI joint pain.  Angie said pain in the posterior left hip and buttock for about 3 months now.  It started when she was doing exercise/workout where she felt a twinge or pain in that location.  She is a very active individual and does see a systems analyst at [employer] well.  He has been doing formalized physical therapy at [facility] but does not feel this has given her much relief.  She was seen by my partner, Dr. Genelle who thought majority of her pain was emanating from the SI joint.   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  She is on Plaquenil /MTX, unable to take NSAIDs because of this.  She does use Tylenol  as needed.  Pertinent ROS were reviewed with the patient and found to be negative unless otherwise specified above in HPI.   Assessment & Plan: Visit Diagnoses:  1. Chronic left SI joint pain   2. Pain in left buttock    Plan: Impression is chronic left posterior hip/SI pain with radiation into the buttock.  Did discuss overall nature of the complexity of the SI joint being a joint as well as connective tissue and overlying musculature.  Through shared decision making, did proceed with ultrasound-guided left SI joint injection for both diagnostic and therapeutic purposes.  She was not making progress with PT, so she will hold from this.  Hopefully once this injection kicks in and she has relief of benefit, she may return to PT or her regular physical activity.  She may use ice/heat and/or Tylenol  for any postinjection pain.  I would like her to see over the  next few weeks to what degree of relief she receives.  If she gets good benefit, she may return to activity as tolerated.  If for some reason she does not get significant relief, she will follow-up with Dr. Genelle for discussion on next steps.  This could include MRI of the posterior hip to evaluate the gluteal musculature.  Could try targeted injection into the piriformis at the ischial space.  Follow-up: Return if symptoms worsen or fail to improve.   Meds & Orders: No orders of the defined types were placed in this encounter.   Orders Placed This Encounter  Procedures   US  Guided Needle Placement - No Linked Charges     Procedures: U/S-guided SI-joint injection, Left   After discussion of risk/benefits/indications, informed verbal consent was obtained. A timeout was then performed. The patient was positioned in a prone position on exam room table with a pillow placed under the pelvis for mild hip flexion. The SI joint area was cleaned and prepped with betadine and alcohol swabs. Sterile ultrasound gel was applied and the ultrasound transducer was placed in an anatomic axial plane over the PSIS, then moved distally over the SI-joint. Using ultrasound guidance, a 22-gauge, 3.5 needle was inserted from a medial to lateral approach utilizing an in-plane approach and directed into the SI-joint. The SI-joint was then injected with a mixture of 4:2 lidocaine:depomedrol with visualization of the injectate flow into the SI-joint under  ultrasound visualization. The patient tolerated the procedure well without immediate complications.       Clinical History: No specialty comments available.  She reports that she has never smoked. She has never used smokeless tobacco. No results for input(s): HGBA1C, LABURIC in the last 8760 hours.  Objective:    Physical Exam  Gen: Well-appearing, in no acute distress; non-toxic CV: Well-perfused. Warm.  Resp: Breathing unlabored on room air; no  wheezing. Psych: Fluid speech in conversation; appropriate affect; normal thought process  Ortho Exam - Left Hip/SI-joint: The left hip moves fluidly with internal and external logroll.  Positive FABER test, negative FADIR test.  There is positive Fortin's point test with more pain emanating within the gluteal musculature.  Imaging:  *4 view left hip x-ray shows very minimal arthritic change about the left hip joint, relatively well-preserved joint space.  There is minimal SI joint sclerosis bilaterally.  Narrative & Impression  EXAM: 4 VIEW(S) XRAY OF THE LEFT HIP 12/09/2023 02:46:50 PM   COMPARISON: None available.   CLINICAL HISTORY: pain   FINDINGS:   BONES AND JOINTS: No radiographic evidence of left hip fracture. No dislocation. There is mild osseous overgrowth of the left acetabulum without hip joint space narrowing.   SOFT TISSUES: Moderate stool burden within the visualized lower abdomen and pelvis.   IMPRESSION: 1. Early/mild degenerative changes of the left acetabulum without hip joint space narrowing. 2. Moderate stool burden in the visualized lower abdomen and pelvis.   Electronically signed by: Donnice Mania MD 12/15/2023 11:39 PM EST RP Workstation: [id number]    Past Medical/Family/Surgical/Social History: Medications & Allergies reviewed per EMR, new medications updated. 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   Melanocytic nevus of trunk 11/24/2023   Primary basal cell carcinoma (BCC) of right shoulder 11/24/2023   Seborrheic keratoses 11/24/2023   Other fatigue 11/24/2023   Migraine without aura and without status migrainosus, not intractable 11/24/2023   Excessive cerumen in both ear canals 10/02/2023   Sensorineural hearing loss (SNHL), bilateral 10/02/2023   Vertigo 08/06/2023   BMI 20.0-20.9, adult 05/05/2023   Medication monitoring  encounter 03/13/2020   At high risk for falls 03/08/2019   Vitreomacular adhesion of right eye 03/03/2019   Refractive error 02/26/2018   Angio-edema 06/24/2017   Age-related nuclear cataract of both eyes 02/16/2017   Dry eye syndrome of both eyes 02/16/2017   Encounter for long-term (current) use of high-risk medication 02/16/2017   On methotrexate therapy 02/16/2017   Osteoarthritis 11/25/2016   Osteoporosis 11/25/2016   Neck swelling 11/14/2016   Mixed connective tissue disease 11/14/2016   Long-term use of immunosuppressant medication 07/24/2016   Primary osteoarthritis of both knees 07/24/2016   Inflammatory arthritis 07/24/2016   Primary insomnia 05/30/2015   Senile osteoporosis 12/30/2014   Depression 08/29/2013   High vitamin D level 08/29/2013   Major depressive disorder, single episode, unspecified 08/29/2013   Major depressive disorder, single episode 08/29/2013   Osteopenia 03/29/2013   Generalized OA 01/08/2012   Generalized osteoarthritis 01/08/2012   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   Connective tissue disease, undifferentiated 01/06/2011   High risk medication use 01/06/2011   Systemic involvement of connective tissue 01/06/2011   Undifferentiated connective tissue disease 01/06/2011   Other long term (current) drug therapy 01/06/2011   Past Medical History:  Diagnosis Date   Autoimmune disorder    pleurisy   Mixed connective tissue disease  Osteoarthritis    Osteoporosis    Rheumatoid arthritis (HCC)    Family History  Problem Relation Age of Onset   Osteoporosis Mother    Deep vein thrombosis Mother    Alzheimer's disease Mother        Lewey body   Hypertension Mother    Hyperlipidemia Mother    Hypertension Father    Osteoporosis Daughter        osteopenia   Breast cancer Maternal Grandmother        in 67's   Colon cancer Paternal Grandfather 17   Past Surgical History:  Procedure Laterality Date   TUBAL LIGATION     1983   Social History    Occupational History   Not on file  Tobacco Use   Smoking status: Never   Smokeless tobacco: Never  Vaping Use   Vaping status: Never Used  Substance and Sexual Activity   Alcohol use: No    Alcohol/week: 0.0 standard drinks of alcohol   Drug use: Never   Sexual activity: Not Currently    Partners: Male    Birth control/protection: Surgical    Comment: BTL

## 2023-12-27 ENCOUNTER — Other Ambulatory Visit: Payer: Self-pay

## 2023-12-27 ENCOUNTER — Observation Stay (HOSPITAL_BASED_OUTPATIENT_CLINIC_OR_DEPARTMENT_OTHER)
Admission: EM | Admit: 2023-12-27 | Discharge: 2023-12-28 | Disposition: A | Attending: Family Medicine | Admitting: Family Medicine

## 2023-12-27 ENCOUNTER — Encounter (HOSPITAL_BASED_OUTPATIENT_CLINIC_OR_DEPARTMENT_OTHER): Payer: Self-pay

## 2023-12-27 DIAGNOSIS — M1380 Other specified arthritis, unspecified site: Secondary | ICD-10-CM | POA: Diagnosis not present

## 2023-12-27 DIAGNOSIS — R1310 Dysphagia, unspecified: Secondary | ICD-10-CM

## 2023-12-27 DIAGNOSIS — I959 Hypotension, unspecified: Secondary | ICD-10-CM | POA: Diagnosis not present

## 2023-12-27 DIAGNOSIS — F419 Anxiety disorder, unspecified: Secondary | ICD-10-CM | POA: Diagnosis not present

## 2023-12-27 DIAGNOSIS — R221 Localized swelling, mass and lump, neck: Secondary | ICD-10-CM | POA: Insufficient documentation

## 2023-12-27 DIAGNOSIS — Z79899 Other long term (current) drug therapy: Secondary | ICD-10-CM | POA: Diagnosis not present

## 2023-12-27 DIAGNOSIS — M359 Systemic involvement of connective tissue, unspecified: Secondary | ICD-10-CM | POA: Diagnosis not present

## 2023-12-27 DIAGNOSIS — T783XXA Angioneurotic edema, initial encounter: Principal | ICD-10-CM | POA: Diagnosis present

## 2023-12-27 DIAGNOSIS — X58XXXA Exposure to other specified factors, initial encounter: Secondary | ICD-10-CM | POA: Diagnosis not present

## 2023-12-27 DIAGNOSIS — M351 Other overlap syndromes: Secondary | ICD-10-CM | POA: Diagnosis not present

## 2023-12-27 DIAGNOSIS — F32A Depression, unspecified: Secondary | ICD-10-CM | POA: Insufficient documentation

## 2023-12-27 DIAGNOSIS — M069 Rheumatoid arthritis, unspecified: Secondary | ICD-10-CM | POA: Diagnosis not present

## 2023-12-27 DIAGNOSIS — Z743 Need for continuous supervision: Secondary | ICD-10-CM | POA: Diagnosis not present

## 2023-12-27 DIAGNOSIS — R609 Edema, unspecified: Secondary | ICD-10-CM | POA: Diagnosis not present

## 2023-12-27 DIAGNOSIS — M064 Inflammatory polyarthropathy: Secondary | ICD-10-CM | POA: Insufficient documentation

## 2023-12-27 DIAGNOSIS — R070 Pain in throat: Secondary | ICD-10-CM | POA: Diagnosis not present

## 2023-12-27 LAB — CBC WITH DIFFERENTIAL/PLATELET
Abs Immature Granulocytes: 0.03 K/uL (ref 0.00–0.07)
Basophils Absolute: 0 K/uL (ref 0.0–0.1)
Basophils Relative: 0 %
Eosinophils Absolute: 0 K/uL (ref 0.0–0.5)
Eosinophils Relative: 0 %
HCT: 40.8 % (ref 36.0–46.0)
Hemoglobin: 13.8 g/dL (ref 12.0–15.0)
Immature Granulocytes: 1 %
Lymphocytes Relative: 13 %
Lymphs Abs: 0.7 K/uL (ref 0.7–4.0)
MCH: 33 pg (ref 26.0–34.0)
MCHC: 33.8 g/dL (ref 30.0–36.0)
MCV: 97.6 fL (ref 80.0–100.0)
Monocytes Absolute: 0.4 K/uL (ref 0.1–1.0)
Monocytes Relative: 6 %
Neutro Abs: 4.4 K/uL (ref 1.7–7.7)
Neutrophils Relative %: 80 %
Platelets: 242 K/uL (ref 150–400)
RBC: 4.18 MIL/uL (ref 3.87–5.11)
RDW: 14.3 % (ref 11.5–15.5)
WBC: 5.5 K/uL (ref 4.0–10.5)
nRBC: 0 % (ref 0.0–0.2)

## 2023-12-27 LAB — BASIC METABOLIC PANEL WITH GFR
Anion gap: 11 (ref 5–15)
BUN: 19 mg/dL (ref 8–23)
CO2: 27 mmol/L (ref 22–32)
Calcium: 9.2 mg/dL (ref 8.9–10.3)
Chloride: 101 mmol/L (ref 98–111)
Creatinine, Ser: 0.74 mg/dL (ref 0.44–1.00)
GFR, Estimated: >60 mL/min
Glucose, Bld: 143 mg/dL — ABNORMAL HIGH (ref 70–99)
Potassium: 4 mmol/L (ref 3.5–5.1)
Sodium: 139 mmol/L (ref 135–145)

## 2023-12-27 LAB — RESP PANEL BY RT-PCR (RSV, FLU A&B, COVID)  RVPGX2
Influenza A by PCR: NEGATIVE
Influenza B by PCR: NEGATIVE
Resp Syncytial Virus by PCR: NEGATIVE
SARS Coronavirus 2 by RT PCR: NEGATIVE

## 2023-12-27 LAB — GROUP A STREP BY PCR: Group A Strep by PCR: NOT DETECTED

## 2023-12-27 MED ORDER — ENOXAPARIN SODIUM 40 MG/0.4ML IJ SOSY
40.0000 mg | PREFILLED_SYRINGE | INTRAMUSCULAR | Status: DC
  Administered 2023-12-27: 40 mg via SUBCUTANEOUS
  Filled 2023-12-27: qty 0.4

## 2023-12-27 MED ORDER — SODIUM CHLORIDE 0.9% FLUSH
3.0000 mL | Freq: Two times a day (BID) | INTRAVENOUS | Status: DC
  Administered 2023-12-27: 3 mL via INTRAVENOUS

## 2023-12-27 MED ORDER — DIPHENHYDRAMINE HCL 50 MG/ML IJ SOLN: 25.0000 mg | Freq: Four times a day (QID) | INTRAMUSCULAR | Status: DC | PRN

## 2023-12-27 MED ORDER — FAMOTIDINE IN NACL 20-0.9 MG/50ML-% IV SOLN: 20.0000 mg | Freq: Two times a day (BID) | INTRAVENOUS | Status: DC

## 2023-12-27 MED ORDER — TRANEXAMIC ACID-NACL 1000-0.7 MG/100ML-% IV SOLN
1000.0000 mg | Freq: Once | INTRAVENOUS | Status: AC
  Administered 2023-12-27: 1000 mg via INTRAVENOUS
  Filled 2023-12-27: qty 100

## 2023-12-27 MED ORDER — DIPHENHYDRAMINE HCL 50 MG/ML IJ SOLN
25.0000 mg | Freq: Four times a day (QID) | INTRAMUSCULAR | Status: DC
  Administered 2023-12-27 – 2023-12-28 (×4): 25 mg via INTRAVENOUS
  Filled 2023-12-27 (×4): qty 1

## 2023-12-27 MED ORDER — ACETAMINOPHEN 650 MG RE SUPP: 650.0000 mg | Freq: Four times a day (QID) | RECTAL | Status: DC | PRN

## 2023-12-27 MED ORDER — ESCITALOPRAM OXALATE 10 MG PO TABS
10.0000 mg | ORAL_TABLET | Freq: Every day | ORAL | Status: DC
  Administered 2023-12-27 – 2023-12-28 (×2): 10 mg via ORAL
  Filled 2023-12-27 (×2): qty 1

## 2023-12-27 MED ORDER — DEXAMETHASONE SOD PHOSPHATE PF 10 MG/ML IJ SOLN
10.0000 mg | Freq: Three times a day (TID) | INTRAMUSCULAR | Status: AC
  Administered 2023-12-27 (×2): 10 mg via INTRAVENOUS

## 2023-12-27 MED ORDER — FAMOTIDINE IN NACL 20-0.9 MG/50ML-% IV SOLN
20.0000 mg | Freq: Two times a day (BID) | INTRAVENOUS | Status: DC
  Administered 2023-12-27: 20 mg via INTRAVENOUS
  Filled 2023-12-27 (×2): qty 50

## 2023-12-27 MED ORDER — SODIUM CHLORIDE 0.9 % IV SOLN
3.0000 g | Freq: Four times a day (QID) | INTRAVENOUS | Status: DC
  Administered 2023-12-27 – 2023-12-28 (×4): 3 g via INTRAVENOUS
  Filled 2023-12-27 (×4): qty 8

## 2023-12-27 MED ORDER — FAMOTIDINE IN NACL 20-0.9 MG/50ML-% IV SOLN
20.0000 mg | Freq: Once | INTRAVENOUS | Status: AC
  Administered 2023-12-27: 20 mg via INTRAVENOUS
  Filled 2023-12-27: qty 50

## 2023-12-27 MED ORDER — PREDNISONE 20 MG PO TABS: 40.0000 mg | ORAL_TABLET | Freq: Every day | ORAL | Status: DC

## 2023-12-27 MED ORDER — METHYLPREDNISOLONE SODIUM SUCC 125 MG IJ SOLR: 125.0000 mg | Freq: Once | INTRAMUSCULAR | Status: DC

## 2023-12-27 MED ORDER — HYDROXYCHLOROQUINE SULFATE 200 MG PO TABS
200.0000 mg | ORAL_TABLET | ORAL | Status: DC
  Administered 2023-12-28: 200 mg via ORAL
  Filled 2023-12-27: qty 1

## 2023-12-27 MED ORDER — DEXAMETHASONE SOD PHOSPHATE PF 10 MG/ML IJ SOLN: 10.0000 mg | Freq: Three times a day (TID) | INTRAMUSCULAR | Status: DC

## 2023-12-27 MED ORDER — ALBUTEROL SULFATE (2.5 MG/3ML) 0.083% IN NEBU: 2.5000 mg | INHALATION_SOLUTION | RESPIRATORY_TRACT | Status: DC | PRN

## 2023-12-27 MED ORDER — ACETAMINOPHEN 325 MG PO TABS: 650.0000 mg | ORAL_TABLET | Freq: Four times a day (QID) | ORAL | Status: DC | PRN

## 2023-12-27 MED ORDER — DIPHENHYDRAMINE HCL 50 MG/ML IJ SOLN
25.0000 mg | Freq: Once | INTRAMUSCULAR | Status: AC
  Administered 2023-12-27: 25 mg via INTRAVENOUS
  Filled 2023-12-27: qty 1

## 2023-12-27 MED ORDER — DEXAMETHASONE SOD PHOSPHATE PF 10 MG/ML IJ SOLN
10.0000 mg | Freq: Once | INTRAMUSCULAR | Status: AC
  Administered 2023-12-27: 10 mg via INTRAVENOUS

## 2023-12-27 MED ORDER — LORAZEPAM 0.5 MG PO TABS
0.5000 mg | ORAL_TABLET | Freq: Every day | ORAL | Status: DC
  Administered 2023-12-27: 0.5 mg via ORAL
  Filled 2023-12-27: qty 1

## 2023-12-27 NOTE — ED Provider Notes (Signed)
 Accepted handoff at shift change from Dr. Ruthe. Please see prior provider note for more detail.   Briefly: Patient is 68 y.o. was seen [facility] this morning by Dr. Ruthe where he diagnosed her with angioedema.  She was then sent to [facility] for an evaluation by ENT.  While there she received Benadryl , Decadron , Pepcid .  When she arrived to [facility] patient was stable.  DDX: concern for angioedema  Plan: Patient was sent from [facility] to [facility] for ENT evaluation for possible angioedema.  Patient has a past medical history of this with that happening about 3 years ago.  She received Decadron , Benadryl , Pepcid  while at [facility].  Once here at [facility], ENT saw the patient and recommended admission for observation.  See ENT note for further recommendations.  She is currently not in any acute distress.  She does not endorse any difficulty breathing or chest pain.  She reports a little difficulty swallowing and speaking.  Patient remained stable while in the ED. 11:46 AM I consulted the hospitalist Dr. Claudene for admission. They will be admitting the patient.       [name], [place] 12/27/23 1148    Ula Prentice SAUNDERS, MD 12/27/23 [phone]

## 2023-12-27 NOTE — ED Notes (Signed)
 [facility]S contacted to refill hand sanitizer dispenser.

## 2023-12-27 NOTE — Consult Note (Signed)
 ENT CONSULT:  Reason for Consult: Angioedema  Referring Physician: ED team  HPI: Patricia Oconnell is an 68 y.o. female with h/o MCTD and RA who ENT was consulted for concern for angioedema. Patient reports: history of angioedema and seen prior in 2019 with workup and admission for steroids. She has had a couple of episodes for this, and denies any recent changes in her health which would trigger this. Perhaps she suspects stress. No problem with breathing but does have some dysphagia. Started yesterday and now progressed so came to ED. No recent changes in diet or issues otherwise. She is feeling better after getting angioedema cocktail.  Patient otherwise denies: - odynophagia - changes in voice, shortness of breath, hemoptysis Not on any ACE-I/ARB. There was some concern for IgG4 disease prior but decided SMG biopsy was unlikely to show this. Has also been evaluated by Dr. Noemi ([facility]) who felt this was assoc with underlying MCTD/RA. C3/4 normal, and C1 esterase thought unlikely and recommended RTC PRN    Past Medical History:  Diagnosis Date   Autoimmune disorder    pleurisy   Mixed connective tissue disease    Osteoarthritis    Osteoporosis    Rheumatoid arthritis (HCC)     Past Surgical History:  Procedure Laterality Date   TUBAL LIGATION     1983    Family History  Problem Relation Age of Onset   Osteoporosis Mother    Deep vein thrombosis Mother    Alzheimer's disease Mother        Lewey body   Hypertension Mother    Hyperlipidemia Mother    Hypertension Father    Osteoporosis Daughter        osteopenia   Breast cancer Maternal Grandmother        in 12's   Colon cancer Paternal Grandfather 49    Social History:  reports that she has never smoked. She has never used smokeless tobacco. She reports that she does not drink alcohol and does not use drugs.  Allergies: Allergies[1]  Medications: I have reviewed the patient's current  medications.  Results for orders placed or performed during the hospital encounter of 12/27/23 (from the past 48 hours)  Group A Strep by PCR     Status: None   Collection Time: 12/27/23  8:16 AM   Specimen: Throat; Sterile Swab  Result Value Ref Range   Group A Strep by PCR NOT DETECTED NOT DETECTED    Comment: Performed at [facility], [address], [place], [place] [address]  Resp panel by RT-PCR (RSV, Flu A&B, Covid) Anterior Nasal Swab     Status: None   Collection Time: 12/27/23  8:16 AM   Specimen: Anterior Nasal Swab  Result Value Ref Range   SARS Coronavirus 2 by RT PCR NEGATIVE NEGATIVE    Comment: (NOTE) SARS-CoV-2 target nucleic acids are NOT DETECTED.  The SARS-CoV-2 RNA is generally detectable in upper respiratory specimens during the acute phase of infection. The lowest concentration of SARS-CoV-2 viral copies this assay can detect is 138 copies/mL. A negative result does not preclude SARS-Cov-2 infection and should not be used as the sole basis for treatment or other patient management decisions. A negative result may occur with  improper specimen collection/handling, submission of specimen other than nasopharyngeal swab, presence of viral mutation(s) within the areas targeted by this assay, and inadequate number of viral copies(<138 copies/mL). A negative result must be combined with clinical observations, patient history, and epidemiological information. The  expected result is Negative.  Fact Sheet for Patients:  bloggercourse.com  Fact Sheet for Healthcare Providers:  seriousbroker.it  This test is no t yet approved or cleared by the [place]  FDA and  has been authorized for detection and/or diagnosis of SARS-CoV-2 by FDA under an Emergency Use Authorization (EUA). This EUA will remain  in effect (meaning this test can be used) for the duration of the COVID-19 declaration under  Section 564(b)(1) of the Act, 21 U.S.C.section 360bbb-3(b)(1), unless the authorization is terminated  or revoked sooner.       Influenza A by PCR NEGATIVE NEGATIVE   Influenza B by PCR NEGATIVE NEGATIVE    Comment: (NOTE) The Xpert Xpress SARS-CoV-2/FLU/RSV plus assay is intended as an aid in the diagnosis of influenza from Nasopharyngeal swab specimens and should not be used as a sole basis for treatment. Nasal washings and aspirates are unacceptable for Xpert Xpress SARS-CoV-2/FLU/RSV testing.  Fact Sheet for Patients: bloggercourse.com  Fact Sheet for Healthcare Providers: seriousbroker.it  This test is not yet approved or cleared by the [place]  FDA and has been authorized for detection and/or diagnosis of SARS-CoV-2 by FDA under an Emergency Use Authorization (EUA). This EUA will remain in effect (meaning this test can be used) for the duration of the COVID-19 declaration under Section 564(b)(1) of the Act, 21 U.S.C. section 360bbb-3(b)(1), unless the authorization is terminated or revoked.     Resp Syncytial Virus by PCR NEGATIVE NEGATIVE    Comment: (NOTE) Fact Sheet for Patients: bloggercourse.com  Fact Sheet for Healthcare Providers: seriousbroker.it  This test is not yet approved or cleared by the [place]  FDA and has been authorized for detection and/or diagnosis of SARS-CoV-2 by FDA under an Emergency Use Authorization (EUA). This EUA will remain in effect (meaning this test can be used) for the duration of the COVID-19 declaration under Section 564(b)(1) of the Act, 21 U.S.C. section 360bbb-3(b)(1), unless the authorization is terminated or revoked.  Performed at [facility], [address] [place], [place] [address]   CBC with Differential     Status: None   Collection Time: 12/27/23  8:48 AM  Result Value Ref Range   WBC  5.5 4.0 - 10.5 K/uL   RBC 4.18 3.87 - 5.11 MIL/uL   Hemoglobin 13.8 12.0 - 15.0 g/dL   HCT 59.1 63.9 - 53.9 %   MCV 97.6 80.0 - 100.0 fL   MCH 33.0 26.0 - 34.0 pg   MCHC 33.8 30.0 - 36.0 g/dL   RDW 85.6 88.4 - 84.4 %   Platelets 242 150 - 400 K/uL   nRBC 0.0 0.0 - 0.2 %   Neutrophils Relative % 80 %   Neutro Abs 4.4 1.7 - 7.7 K/uL   Lymphocytes Relative 13 %   Lymphs Abs 0.7 0.7 - 4.0 K/uL   Monocytes Relative 6 %   Monocytes Absolute 0.4 0.1 - 1.0 K/uL   Eosinophils Relative 0 %   Eosinophils Absolute 0.0 0.0 - 0.5 K/uL   Basophils Relative 0 %   Basophils Absolute 0.0 0.0 - 0.1 K/uL   Immature Granulocytes 1 %   Abs Immature Granulocytes 0.03 0.00 - 0.07 K/uL    Comment: Performed at [facility], [address] [place], [place] [address]  Basic metabolic panel     Status: Abnormal   Collection Time: 12/27/23  8:48 AM  Result Value Ref Range   Sodium 139 135 - 145 mmol/L   Potassium 4.0 3.5 - 5.1  mmol/L   Chloride 101 98 - 111 mmol/L   CO2 27 22 - 32 mmol/L   Glucose, Bld 143 (H) 70 - 99 mg/dL    Comment: Glucose reference range applies only to samples taken after fasting for at least 8 hours.   BUN 19 8 - 23 mg/dL   Creatinine, Ser 9.25 0.44 - 1.00 mg/dL   Calcium 9.2 8.9 - 89.6 mg/dL   GFR, Estimated >39 >39 mL/min    Comment: (NOTE) Calculated using the CKD-EPI Creatinine Equation (2021)    Anion gap 11 5 - 15    Comment: Performed at [facility], [address], [place], [place] [address]    No results found.  ROS: as above  Blood pressure (!) 129/51, pulse 63, temperature 98.4 F (36.9 C), resp. rate 16, height 5' 3 (1.6 m), weight 51.7 kg, SpO2 98%.  PHYSICAL EXAM:  CONSTITUTIONAL: well developed, nourished, no distress and alert and oriented x 3 CARDIOVASCULAR: normal rate PULMONARY/CHEST WALL: effort normal and no stridor, no stertor, no dysphonia; easily lays flat and tolerates secretions HENT: Head :  normocephalic and atraumatic Ears: Right ear:   canal normal, external ear normal and hearing normal Left ear:   canal normal, external ear normal and hearing normal Nose: nose normal and no purulence Mouth/Throat:  Mouth: uvula midline, slight water tight edema; no oral cavity swelling appreciated Throat: oropharynx clear and moist Mucous membranes: normal EYES: conjunctiva normal, EOM normal and PERRL NECK: supple, trachea normal and no thyromegaly or cervical LAD  Studies Reviewed: Prior [facility], Allergy notes reviewed  Assessment/Plan: 68 y.o. with:  Angioedema Unclear trigger, prior has had eval for this and improved with angioedema cocktail. Exam today shows some pyriform, mild AE fullness but endolarynx and epiglottis and tongue base without edema. Airway is overall patent and not in any distress but given TFL, would recommend admission for observation  - Recommend decadron  10mg  q8h IV for 24 hours, then medrol  dose pack - Recommend IV pepcid  20mg  BID - Recommend Benadryl  25mg  q6h - Given slight/small left AE fold discoloration -- wonder if maybe underlying small infection and recommend unasyn  in house (though not likely) - Ok for liquid diet and if doing well clinically this evening, can ADAT - IF declines clinically, please page ENT - Will follow, If doing well tomorrow, can consider discharge  Patricia Oconnell   12/27/2023, 11:15 AM   MDM: time spent on this encounter in care coordination and face to face evaluation -- 65 minutes    [1]  Allergies Allergen Reactions   Azathioprine Itching   Leflunomide Itching   Sulfa Antibiotics     Unsure of reaction   Sulfamethoxazole Rash    Pt does not remember

## 2023-12-27 NOTE — ED Notes (Signed)
 ENT at bedside

## 2023-12-27 NOTE — ED Triage Notes (Signed)
 Arrives ambulatory to the ED with complaints of throat/neck swelling and discomfort that started yesterday and worsened overnight.    Hx of Rheumatoid Arthritis

## 2023-12-27 NOTE — H&P (Signed)
 " History and Physical    Patient: Patricia Oconnell FMW:[id number] DOB: [birth year] DOA: 12/27/2023 DOS: the patient was seen and examined on 12/27/2023 PCP: Patricia Jenkins Jansky, MD  Patient coming from: Transfer from [facility]  Chief Complaint:  Chief Complaint  Patient presents with   Throat/Neck Swelling   HPI: Patricia Oconnell is a 68 y.o. female with medical history significant of rheumatoid arthritis, mixed connective tissue disease, and history of pleurisy presents with swelling and pain in the jaw and neck area.  She began experiencing symptoms around noon the previous day, describing a sensation of 'creeping in and starting to spill.' By the evening, the symptoms had intensified significantly, with swelling in the jaw area, soreness, and pain radiating throughout the neck area, which she identifies as a flare-up. She is experiencing difficulty swallowing, which persists despite treatment.  She has a history of similar flare-ups, with the last occurrence being many years ago, possibly in 2018. She is surprised by the flare-up due to her regular medication regimen. No recent dietary changes or visits to new restaurants that could have triggered the symptoms. No rash, fever, or chills accompany the current episode.  Her current medications include Plaquenil , taken Monday through Friday, and methotrexate, taken on Saturdays. She is also on Lexapro , which she takes in the morning, and Ativan  0.5 mg at bedtime. She is trying to wean off Lexapro  with her doctor's assistance. She recently received a steroid shot in her hip, which she believes might have been a steroid, but is unsure.  In the emergency department patient was noted to be afebrile with stable vital signs.  Labs were significant for glucose 143.  Influenza, COVID-19, and RSV screening were negative.  She had been given Decadron  10 mg IV patient was evaluated by ENT they preformed laryngoscopy and  recommended admission for observation.   Patient had been given Unasyn  as there was small concern for possibility of infection on their evaluation.   Review of Systems: As mentioned in the history of present illness. All other systems reviewed and are negative. Past Medical History:  Diagnosis Date   Autoimmune disorder    pleurisy   Mixed connective tissue disease    Osteoarthritis    Osteoporosis    Rheumatoid arthritis (HCC)    Past Surgical History:  Procedure Laterality Date   TUBAL LIGATION     1983   Social History:  reports that she has never smoked. She has never used smokeless tobacco. She reports that she does not drink alcohol and does not use drugs.  Allergies[1]  Family History  Problem Relation Age of Onset   Osteoporosis Mother    Deep vein thrombosis Mother    Alzheimer's disease Mother        Lewey body   Hypertension Mother    Hyperlipidemia Mother    Hypertension Father    Osteoporosis Daughter        osteopenia   Breast cancer Maternal Grandmother        in 56's   Colon cancer Paternal Grandfather 60    Prior to Admission medications  Medication Sig Start Date End Date Taking? Authorizing Provider  Adalimumab (HUMIRA) 40 MG/0.4ML PSKT Inject into the skin every 14 (fourteen) days.    [provider]  Calcium Citrate (CITRACAL PO) Take 2 tablets daily by mouth.     [provider]  Cholecalciferol (VITAMIN D PO) Take 2,000 Int'l Units by mouth daily.     [provider]  Cyanocobalamin (  VITAMIN B 12 PO) Take by mouth daily.    [provider]  denosumab (PROLIA) 60 MG/ML SOSY injection Inject 60 mg into the skin every 6 (six) months.    [provider]  escitalopram  (LEXAPRO ) 10 MG tablet Take 10 mg by mouth daily.    [provider]  estradiol (ESTRACE) 0.01 % CREA vaginal cream Place 0.5 g vaginally. 11/18/23   [provider]  folic acid  (FOLVITE ) 1 MG tablet Take 1 mg by mouth daily.  03/26/13   [provider]   hydroxychloroquine  (PLAQUENIL ) 200 MG tablet Take by mouth daily.    [provider]  hydrOXYzine  (VISTARIL ) 25 MG capsule Take 1 capsule (25 mg total) by mouth every 8 (eight) hours as needed. 12/15/23   Patricia Jenkins Jansky, MD  LORazepam  (ATIVAN ) 1 MG tablet Take 0.5 mg at bedtime by mouth.  04/07/14   [provider]  methotrexate (RHEUMATREX) 2.5 MG tablet Take by mouth. Take 6 tabs BY MOUTH ONCE A WEEK. 3 TABS IN THE AM & 3 IN THE PM 12/29/12   [provider]    Physical Exam: Vitals:   12/27/23 0852 12/27/23 1006 12/27/23 1045 12/27/23 1115  BP:  (!) 112/50 (!) 129/51 (!) 121/50  Pulse:  61 63 62  Resp:  16 16 16   Temp:  98.4 F (36.9 C)    TempSrc:      SpO2: 99% 100% 98% 96%  Weight:      Height:       Constitutional: NAD, calm, comfortable Eyes: PERRL, lids and conjunctivae normal ENMT: Mucous membranes are moist.  Slight swelling noted of the.Normal dentition.  Neck: normal, supple, no masses, no thyromegaly.  No significant stridor appreciated. Respiratory: clear to auscultation bilaterally, no wheezing, no crackles. Normal respiratory effort. No accessory muscle use.  Cardiovascular: Regular rate and rhythm, no murmurs / rubs / gallops. No extremity edema. 2+ pedal pulses. No carotid bruits.  Abdomen: no tenderness, no masses palpated. No hepatosplenomegaly. Bowel sounds positive.  Musculoskeletal: no clubbing / cyanosis. No joint deformity upper and lower extremities. Good ROM, no contractures. Normal muscle tone.  Skin: no rashes, lesions, ulcers. No induration Neurologic: CN 2-12 grossly intact. Sensation intact, DTR normal. Strength 5/5 in all 4.  Psychiatric: Normal judgment and insight. Alert and oriented x 3. Normal mood.    Data Reviewed:  Reviewed labs, imaging, and pertinent records as documented.  Assessment and Plan:  Neck swelling secondary to suspected angioedema Acute.  Patient presents with complaints of swelling and pain in  her neck and jaw.  Denies any recent changes in medications or new foods to possibly onset symptoms.  She is not on ACE inhibitors/ARB.   - Admit to a telemetry bed - Aspiration precautions with elevation head of bed - Clear  liquid diet and advance as tolerated - Tranexamic acid  - Decadron  10 mg IV every 8 hours for 24 hours, then transition to Medrol  Dosepak - Pepcid  20 mg IV twice daily - Benadryl  25 mg IV every 6 hours - Continue Unasyn  while in house - If noted to have concerns for airway compromise may warrant epinephrine  and consult to PCCM. - Appreciate ENT consultative services    Mixed connective tissue disorder Rheumatoid arthritis Patient on Plaquenil  and methotrexate.  Review of records note that previously when episode occurred back in 2018 symptoms were thought likely related to her significant tissue disorder. - Continue Plaquenil  - Continue outpatient follow-up with rheumatology.  DVT prophylaxis: Lovenox  Advance  Care Planning:   Code Status: Full Code   Consults: ENT  Family Communication: none  Severity of Illness: The appropriate patient status for this patient is OBSERVATION. Observation status is judged to be reasonable and necessary in order to provide the required intensity of service to ensure the patient's safety. The patient's presenting symptoms, physical exam findings, and initial radiographic and laboratory data in the context of their medical condition is felt to place them at decreased risk for further clinical deterioration. Furthermore, it is anticipated that the patient will be medically stable for discharge from the hospital within 2 midnights of admission.   Author: Maximino DELENA Sharps, MD 12/27/2023 11:32 AM  For on call review [email or web address].      [1]  Allergies Allergen Reactions   Azathioprine Itching   Leflunomide Itching   Sulfa Antibiotics     Unsure of reaction   Sulfamethoxazole Rash    Pt does not remember   "

## 2023-12-27 NOTE — ED Notes (Signed)
 Patricia Oconnell with cl called for transport to [facility] ED, Dr Ula accepting

## 2023-12-27 NOTE — Procedures (Signed)
 Procedure Note Pre-procedure diagnosis: Angioedema, dysphagia Post-procedure diagnosis: Same Procedure: Transnasal Fiberoptic Laryngoscopy, CPT 31575 - Mod 25 Indication: see above Complications: None apparent EBL: 0 mL  The procedure was undertaken to further evaluate the patient's complaint above  Procedure:  Patient was identified as correct patient. Verbal consent was obtained. The nose was sprayed with oxymetazoline and 4% lidocaine. The The flexible laryngoscope was passed through the nose to view the nasal cavity, pharynx (oropharynx, hypopharynx) and larynx.  The larynx was examined at rest and during multiple phonatory tasks. Documentation was obtained and reviewed with patient. The scope was removed. The patient tolerated the procedure well.  Findings: The nasal cavity and nasopharynx did not reveal any masses or lesions, mucosa appeared to be without obvious lesions. The tongue base, pharyngeal walls, piriform sinuses, vallecula, epiglottis and postcricoid region are normal in appearance EXCEPT: modest pyriform bilateral water tight swelling and some AE fold; otherwise no significant swelling and endolarynx/VF without edema. The visualized portion of the subglottis and proximal trachea is widely patent. The vocal folds are mobile bilaterally. There are no lesions on the free edge of the vocal folds nor elsewhere in the larynx worrisome for malignancy.      Electronically signed by: Eldora KATHEE Blanch, MD 12/27/2023 11:24 AM

## 2023-12-27 NOTE — ED Provider Notes (Signed)
 " [place] EMERGENCY DEPARTMENT AT [facility] Provider Note   CSN: [id number] Arrival date & time: 12/27/23  9186     Patient presents with: Throat/Neck Swelling   Patricia Oconnell is a 68 y.o. female.   Is here with swelling to the back of the throat hoarse voice similar to when she has had angioedema in the past.  She has rheumatoid arthritis connective tissue disease.  Been a couple years since this is occurred.  She is not on any ACE inhibitors.  They were not quite sure why this happened in the past with thought likely from her rheumatological disease.  Started around noon yesterday has slowly gotten worse.  Started to have more discomfort in the back of her throat.  Denies any fevers or chills.  Some discomfort when she is trying to swallow eat and drink.  The history is provided by the patient.       Prior to Admission medications  Medication Sig Start Date End Date Taking? Authorizing Provider  Adalimumab (HUMIRA) 40 MG/0.4ML PSKT Inject into the skin every 14 (fourteen) days.    [provider]  Calcium Citrate (CITRACAL PO) Take 2 tablets daily by mouth.     [provider]  Cholecalciferol (VITAMIN D PO) Take 2,000 Int'l Units by mouth daily.     [provider]  Cyanocobalamin (VITAMIN B 12 PO) Take by mouth daily.    [provider]  denosumab (PROLIA) 60 MG/ML SOSY injection Inject 60 mg into the skin every 6 (six) months.    [provider]  escitalopram  (LEXAPRO ) 10 MG tablet Take 10 mg by mouth daily.    [provider]  estradiol (ESTRACE) 0.01 % CREA vaginal cream Place 0.5 g vaginally. 11/18/23   [provider]  folic acid  (FOLVITE ) 1 MG tablet Take 1 mg by mouth daily.  03/26/13   [provider]  hydroxychloroquine  (PLAQUENIL ) 200 MG tablet Take by mouth daily.    [provider]  hydrOXYzine  (VISTARIL ) 25 MG capsule Take 1 capsule (25 mg total) by mouth every 8 (eight)  hours as needed. 12/15/23   Wendolyn Jenkins Jansky, MD  LORazepam  (ATIVAN ) 1 MG tablet Take 0.5 mg at bedtime by mouth.  04/07/14   [provider]  methotrexate (RHEUMATREX) 2.5 MG tablet Take by mouth. Take 6 tabs BY MOUTH ONCE A WEEK. 3 TABS IN THE AM & 3 IN THE PM 12/29/12   [provider]    Allergies: Azathioprine, Leflunomide, Sulfa antibiotics, and Sulfamethoxazole    Review of Systems  Updated Vital Signs BP (!) 114/57 (BP Location: Right Arm)   Pulse (!) 59   Temp 98.5 F (36.9 C) (Oral)   Resp 20   Ht 5' 3 (1.6 m)   Wt 51.7 kg   SpO2 99%   BMI 20.19 kg/m   Physical Exam Vitals and nursing note reviewed.  Constitutional:      General: She is not in acute distress.    Appearance: She is well-developed.  HENT:     Head: Normocephalic and atraumatic.     Comments: Edema to her uvula which is midline, edema into both tonsils but the oropharynx is patent she had a little bit of hoarseness to her voice with normal range of motion of the neck, no submandibular swelling, no swelling of the lips tongue Eyes:     Conjunctiva/sclera: Conjunctivae normal.  Cardiovascular:     Rate and Rhythm: Normal rate and regular rhythm.  Pulses: Normal pulses.     Heart sounds: No murmur heard. Pulmonary:     Effort: Pulmonary effort is normal. No respiratory distress.     Breath sounds: Normal breath sounds.  Abdominal:     Palpations: Abdomen is soft.     Tenderness: There is no abdominal tenderness.  Musculoskeletal:        General: No swelling.     Cervical back: Normal range of motion and neck supple. No rigidity.  Skin:    General: Skin is warm and dry.     Capillary Refill: Capillary refill takes less than 2 seconds.  Neurological:     Mental Status: She is alert.  Psychiatric:        Mood and Affect: Mood normal.     (all labs ordered are listed, but only abnormal results are displayed) Labs Reviewed  GROUP A STREP BY PCR  RESP PANEL BY RT-PCR (RSV, FLU  A&B, COVID)  [id number]  CBC WITH DIFFERENTIAL/PLATELET  BASIC METABOLIC PANEL WITH GFR    EKG: None  Radiology: No results found.   Procedures   Medications Ordered in the ED  diphenhydrAMINE  (BENADRYL ) injection 25 mg (has no administration in time range)  famotidine  (PEPCID ) IVPB 20 mg premix (has no administration in time range)  dexamethasone  (DECADRON ) injection 10 mg (has no administration in time range)                                    Medical Decision Making Amount and/or Complexity of Data Reviewed Labs: ordered.  Risk Prescription drug management.   Patricia Oconnell is here with angioedema.  History of the same.  Normal vitals.  No fever.  Not on ACE inhibitor.  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  Started yesterday around noon slowly gotten worse.  Will give Decadron  Pepcid  Benadryl .  I talked with Dr. Tobie with ENT will have her transferred to [facility], ED for evaluation by the ENT team and likely to admit overnight for observation and make sure she improves.  Right now she got a little bit of hoarseness to her voice but she is doing well.  I do not think she needs intubation.  She has no swelling of the lips or tongue or any submandibular space.  Will have her evaluated by ENT I have made Dr. Ula aware of patient being transferred to [facility], ED for evaluation by ENT.  Please call them when she arrives.  Anticipate admission.  Will send basic labs strep and flu but I do think that this is likely angioedema.  Stable throughout my care.  This chart was dictated using voice recognition software.  Despite best efforts to proofread,  errors can occur which can change the documentation meaning.      Final diagnoses:  Angioedema, initial encounter    ED Discharge Orders     None          Ruthe Cornet, DO 12/27/23 9158  "

## 2023-12-27 NOTE — ED Notes (Addendum)
 Pt transfer from [facility] for angioedema, awaiting consultation with ENT. Pt c/o sore throat and swelling, hx angioedema and arthritis. Given benadryl  pepcid  and decadron . Reports swelling has decreased a little . A/O x4, ambulatory to bed upon arrival, speaking in complete sentences.   116/58 98% room air

## 2023-12-27 NOTE — Plan of Care (Signed)

## 2023-12-28 DIAGNOSIS — T783XXA Angioneurotic edema, initial encounter: Secondary | ICD-10-CM | POA: Diagnosis not present

## 2023-12-28 LAB — CBC
HCT: 38.4 % (ref 36.0–46.0)
Hemoglobin: 12.8 g/dL (ref 12.0–15.0)
MCH: 32.8 pg (ref 26.0–34.0)
MCHC: 33.3 g/dL (ref 30.0–36.0)
MCV: 98.5 fL (ref 80.0–100.0)
Platelets: 241 K/uL (ref 150–400)
RBC: 3.9 MIL/uL (ref 3.87–5.11)
RDW: 14.6 % (ref 11.5–15.5)
WBC: 4.6 K/uL (ref 4.0–10.5)
nRBC: 0.4 % — ABNORMAL HIGH (ref 0.0–0.2)

## 2023-12-28 LAB — BASIC METABOLIC PANEL WITH GFR
Anion gap: 9 (ref 5–15)
BUN: 18 mg/dL (ref 8–23)
CO2: 27 mmol/L (ref 22–32)
Calcium: 8.8 mg/dL — ABNORMAL LOW (ref 8.9–10.3)
Chloride: 105 mmol/L (ref 98–111)
Creatinine, Ser: 0.68 mg/dL (ref 0.44–1.00)
GFR, Estimated: >60 mL/min
Glucose, Bld: 142 mg/dL — ABNORMAL HIGH (ref 70–99)
Potassium: 4.6 mmol/L (ref 3.5–5.1)
Sodium: 141 mmol/L (ref 135–145)

## 2023-12-28 LAB — HIV ANTIBODY (ROUTINE TESTING W REFLEX): HIV Screen 4th Generation wRfx: NONREACTIVE

## 2023-12-28 MED ORDER — METHYLPREDNISOLONE 4 MG PO TBPK: ORAL_TABLET | ORAL | 0 refills | Status: AC

## 2023-12-28 MED ORDER — AMOXICILLIN-POT CLAVULANATE 875-125 MG PO TABS: 1.0000 | ORAL_TABLET | Freq: Two times a day (BID) | ORAL | 0 refills | Status: AC

## 2023-12-28 MED ORDER — METHYLPREDNISOLONE 4 MG PO TBPK: ORAL_TABLET | ORAL | 0 refills | Status: DC

## 2023-12-28 NOTE — TOC CM/SW Note (Signed)
 Transition of Care [facility]) - Inpatient Brief Assessment   Patient Details  Name: Patricia Oconnell MRN: [id number] Date of Birth: 12-04-1955  [facility]) CM/SW Contact:    Waddell Barnie Rama, RN Phone Number: 12/28/2023, 9:03 AM   Clinical Narrative: From home with spouse, has PCP and insurance on file, states has no HH services in place at this time or DME at home.  States family member will transport them home at [employer] and family is support system,   Pta self ambulatory.   There are no ICM  needs identified  at this time.  Please place consult for ICM needs.     Transition of Care Asessment: Insurance and Status: Insurance coverage has been reviewed Patient has primary care physician: Yes Home environment has been reviewed: home with family Prior level of function:: indep Prior/Current Home Services: No current home services Social Drivers of Health Review: SDOH reviewed no interventions necessary Readmission risk has been reviewed: Yes Transition of care needs: no transition of care needs at this time

## 2023-12-28 NOTE — Care Management Obs Status (Signed)
 MEDICARE OBSERVATION STATUS NOTIFICATION   Patient Details  Name: Patricia Oconnell MRN: [id number] Date of Birth: Sep 06, 1955   Medicare Observation Status Notification Given:  Yes    Vonzell Arrie Sharps 12/28/2023, 8:43 AM

## 2023-12-28 NOTE — TOC Transition Note (Signed)
 Transition of Care [facility]) - Discharge Note   Patient Details  Name: Patricia Oconnell MRN: [id number] Date of Birth: [birth year]  [facility] CM/SW Contact:  Waddell Barnie Rama, RN Phone Number: 12/28/2023, 9:04 AM   Clinical Narrative:    For dc today has no needs.         Patient Goals and CMS Choice            Discharge Placement                       Discharge Plan and Services Additional resources added to the After Visit Summary for                                       Social Drivers of Health (SDOH) Interventions SDOH Screenings   Food Insecurity: No Food Insecurity (12/27/2023)  Housing: Low Risk (12/27/2023)  Transportation Needs: No Transportation Needs (12/27/2023)  Utilities: Not At Risk (12/27/2023)  Depression (PHQ2-9): Low Risk (11/24/2023)  Financial Resource Strain: Low Risk (11/20/2023)  Physical Activity: Sufficiently Active (11/20/2023)  Social Connections: Moderately Integrated (12/27/2023)  Stress: No Stress Concern Present (11/20/2023)  Tobacco Use: Low Risk (12/27/2023)     Readmission Risk Interventions     No data to display

## 2023-12-28 NOTE — Hospital Course (Signed)
 Patricia Oconnell is a 68 y.o. female with a history of rheumatoid arthritis, mixed connective tissue disease, depression, anxiety.  Patient presented secondary to throat/neck swelling consistent with angioedema. Patient started on steroids, famotidine , benadryl  for management. ENT consulted and performed a transnasal fiberoptic laryngoscopy revealing mild swelling. Recommendation to continue steroid with taper, in addition to antibiotics.

## 2023-12-28 NOTE — Discharge Summary (Signed)
 " Physician Discharge Summary   Patient: Patricia Oconnell MRN: [id number] DOB: 03-01-1955  Admit date:     12/27/2023  Discharge date: 12/28/2023  Discharge Physician: Elgin Lam, MD   PCP: Wendolyn Jenkins Jansky, MD   Recommendations at discharge:  PCP visit for hospital follow-up  Discharge Diagnoses: Principal Problem:   Angioedema Active Problems:   Connective tissue disease, undifferentiated   Inflammatory arthritis  Resolved Problems:   * No resolved hospital problems. [facility] Course: KESHANNA RISO is a 68 y.o. female with a history of rheumatoid arthritis, mixed connective tissue disease, depression, anxiety.  Patient presented secondary to throat/neck swelling consistent with angioedema. Patient started on steroids, famotidine , benadryl  for management. ENT consulted and performed a transnasal fiberoptic laryngoscopy revealing mild swelling. Recommendation to continue steroid with taper, in addition to antibiotics.  Assessment and Plan:  Angioedema Unclear trigger/etiology. History of similar presentation in the past. Patient stated on IV decadron , famotidine  and benadryl . ENT consulted and performed a transnasal fiberoptic laryngoscopy revealing modest pyriform bilateral water tight swelling and aryepiglottic fold. ENT also recommended addition of Unasyn  IV. Patient's symptoms improved significantly prior to discharge. ENT recommendation for a steroid taper and antibiotics on discharge.  Rheumatoid arthritis Mixed connective tissue disorder Continue home regimen of Plaquenil , methotrexate and Humira  Depression Anxiety Continue Lexapro  and Ativan .   Consultants: ENT Procedures performed: None  Disposition: Home Diet recommendation: Regular diet   DISCHARGE MEDICATION: Allergies as of 12/28/2023       Reactions   Azathioprine Itching   Leflunomide Itching   Sulfa Antibiotics    Unsure of reaction   Sulfamethoxazole Rash   Pt does not remember         Medication List     TAKE these medications    amoxicillin -clavulanate 875-125 MG tablet Commonly known as: AUGMENTIN  Take 1 tablet by mouth 2 (two) times daily for 6 days.   CITRACAL PO Take 2 tablets daily by mouth.   Creatine Powd Take 2.5 g by mouth daily.   denosumab 60 MG/ML Sosy injection Commonly known as: PROLIA Inject 60 mg into the skin every 6 (six) months.   escitalopram  10 MG tablet Commonly known as: LEXAPRO  Take 10 mg by mouth daily.   estradiol 0.01 % Crea vaginal cream Commonly known as: ESTRACE Place 0.5 g vaginally at bedtime.   folic acid  1 MG tablet Commonly known as: FOLVITE  Take 1 mg by mouth daily.   Humira (2 Syringe) 40 MG/0.4ML prefilled syringe Generic drug: adalimumab Inject into the skin every 14 (fourteen) days.   hydroxychloroquine  200 MG tablet Commonly known as: PLAQUENIL  Take 200 mg by mouth See admin instructions. Only Mondays thru Friday   hydrOXYzine  25 MG capsule Commonly known as: VISTARIL  Take 1 capsule (25 mg total) by mouth every 8 (eight) hours as needed.   LORazepam  1 MG tablet Commonly known as: ATIVAN  Take 0.5 mg at bedtime by mouth.   methotrexate 2.5 MG tablet Commonly known as: RHEUMATREX Take by mouth. Take 5 tabs BY MOUTH ONCE A WEEK. 3 TABS IN THE AM & 2 IN THE PM Only Saturday and Sundays   methylPREDNISolone  4 MG Tbpk tablet Commonly known as: MEDROL  DOSEPAK Take 6 tablets (24 mg total) by mouth daily for 1 day, THEN 5 tablets (20 mg total) daily for 1 day, THEN 4 tablets (16 mg total) daily for 1 day, THEN 3 tablets (12 mg total) daily for 1 day, THEN 2 tablets (8 mg total) daily for 1  day, THEN 1 tablet (4 mg total) daily for 1 day. Start taking on: December 29, 2023   multivitamin with minerals tablet Take 1 tablet by mouth daily.   VITAMIN B 12 PO Take 1 tablet by mouth daily.   VITAMIN D-VITAMIN K PO Take 1 tablet by mouth daily.        Discharge Exam: BP (!) 115/52 (BP Location: Right  Arm)   Pulse 66   Temp (!) 97.5 F (36.4 C) (Oral)   Resp 19   Ht 5' 3.5 (1.613 m)   Wt 51.8 kg   SpO2 100%   BMI 19.91 kg/m   General exam: Appears calm and comfortable. Respiratory system: Clear to auscultation. Respiratory effort normal. Cardiovascular system: S1 & S2 heard, RRR. Gastrointestinal system: Abdomen is nondistended, soft and nontender. Normal bowel sounds heard. Central nervous system: Alert and oriented. No focal neurological deficits. Musculoskeletal: No edema. No calf tenderness Psychiatry: Judgement and insight appear normal. Mood & affect appropriate.   Condition at discharge: stable  The results of significant diagnostics from this hospitalization (including imaging, microbiology, ancillary and laboratory) are listed below for reference.   Imaging Studies: DG Hip Unilat W OR W/O Pelvis Min 4 Views Left Result Date: 12/15/2023 EXAM: 4 VIEW(S) XRAY OF THE LEFT HIP 12/09/2023 02:46:50 PM COMPARISON: None available. CLINICAL HISTORY: pain FINDINGS: BONES AND JOINTS: No radiographic evidence of left hip fracture. No dislocation. There is mild osseous overgrowth of the left acetabulum without hip joint space narrowing. SOFT TISSUES: Moderate stool burden within the visualized lower abdomen and pelvis. IMPRESSION: 1. Early/mild degenerative changes of the left acetabulum without hip joint space narrowing. 2. Moderate stool burden in the visualized lower abdomen and pelvis. Electronically signed by: Donnice Mania MD 12/15/2023 11:39 PM EST RP Workstation: [id number]    Microbiology: Results for orders placed or performed during the hospital encounter of 12/27/23  Group A Strep by PCR     Status: None   Collection Time: 12/27/23  8:16 AM   Specimen: Throat; Sterile Swab  Result Value Ref Range Status   Group A Strep by PCR NOT DETECTED NOT DETECTED Final    Comment: Performed at [facility], [address], [place], [place] [address]  Resp panel  by RT-PCR (RSV, Flu A&B, Covid) Anterior Nasal Swab     Status: None   Collection Time: 12/27/23  8:16 AM   Specimen: Anterior Nasal Swab  Result Value Ref Range Status   SARS Coronavirus 2 by RT PCR NEGATIVE NEGATIVE Final    Comment: (NOTE) SARS-CoV-2 target nucleic acids are NOT DETECTED.  The SARS-CoV-2 RNA is generally detectable in upper respiratory specimens during the acute phase of infection. The lowest concentration of SARS-CoV-2 viral copies this assay can detect is 138 copies/mL. A negative result does not preclude SARS-Cov-2 infection and should not be used as the sole basis for treatment or other patient management decisions. A negative result may occur with  improper specimen collection/handling, submission of specimen other than nasopharyngeal swab, presence of viral mutation(s) within the areas targeted by this assay, and inadequate number of viral copies(<138 copies/mL). A negative result must be combined with clinical observations, patient history, and epidemiological information. The expected result is Negative.  Fact Sheet for Patients:  bloggercourse.com  Fact Sheet for Healthcare Providers:  seriousbroker.it  This test is no t yet approved or cleared by the [place]  FDA and  has been authorized for detection and/or diagnosis of SARS-CoV-2 by FDA under an Emergency  Use Authorization (EUA). This EUA will remain  in effect (meaning this test can be used) for the duration of the COVID-19 declaration under Section 564(b)(1) of the Act, 21 U.S.C.section 360bbb-3(b)(1), unless the authorization is terminated  or revoked sooner.       Influenza A by PCR NEGATIVE NEGATIVE Final   Influenza B by PCR NEGATIVE NEGATIVE Final    Comment: (NOTE) The Xpert Xpress SARS-CoV-2/FLU/RSV plus assay is intended as an aid in the diagnosis of influenza from Nasopharyngeal swab specimens and should not be used as a sole  basis for treatment. Nasal washings and aspirates are unacceptable for Xpert Xpress SARS-CoV-2/FLU/RSV testing.  Fact Sheet for Patients: bloggercourse.com  Fact Sheet for Healthcare Providers: seriousbroker.it  This test is not yet approved or cleared by the [place]  FDA and has been authorized for detection and/or diagnosis of SARS-CoV-2 by FDA under an Emergency Use Authorization (EUA). This EUA will remain in effect (meaning this test can be used) for the duration of the COVID-19 declaration under Section 564(b)(1) of the Act, 21 U.S.C. section 360bbb-3(b)(1), unless the authorization is terminated or revoked.     Resp Syncytial Virus by PCR NEGATIVE NEGATIVE Final    Comment: (NOTE) Fact Sheet for Patients: bloggercourse.com  Fact Sheet for Healthcare Providers: seriousbroker.it  This test is not yet approved or cleared by the [place]  FDA and has been authorized for detection and/or diagnosis of SARS-CoV-2 by FDA under an Emergency Use Authorization (EUA). This EUA will remain in effect (meaning this test can be used) for the duration of the COVID-19 declaration under Section 564(b)(1) of the Act, 21 U.S.C. section 360bbb-3(b)(1), unless the authorization is terminated or revoked.  Performed at [facility], [address], [place], [place] [address]     Labs: CBC: Recent Labs  Lab 12/27/23 0848 12/28/23 0237  WBC 5.5 4.6  NEUTROABS 4.4  --   HGB 13.8 12.8  HCT 40.8 38.4  MCV 97.6 98.5  PLT 242 241   Basic Metabolic Panel: Recent Labs  Lab 12/27/23 0848 12/28/23 0237  NA 139 141  K 4.0 4.6  CL 101 105  CO2 27 27  GLUCOSE 143* 142*  BUN 19 18  CREATININE 0.74 0.68  CALCIUM 9.2 8.8*     Discharge time spent: 35 minutes.  Signed: Elgin Lam, MD [facility] 12/28/2023 "

## 2023-12-28 NOTE — Discharge Instructions (Signed)
 Patricia Oconnell Neighbor,  You were in the hospital with swelling of your throat, called angioedema. This has improved with steroids and antibiotics. The ENT physician evaluated and is pleased with your progress. Please follow-up with your PCP.

## 2023-12-28 NOTE — Progress Notes (Signed)
 ENT CONSULT:  Reason for Consult: Angioedema   Referring Physician: ED team   HPI: Patricia Oconnell is an 68 y.o. female with h/o MCTD and RA who ENT was consulted for concern for angioedema. Patient reports: history of angioedema and seen prior in 2019 with workup and admission for steroids. She has had a couple of episodes for this, and denies any recent changes in her health which would trigger this. Perhaps she suspects stress. No problem with breathing but does have some dysphagia. Started yesterday and now progressed so came to ED. No recent changes in diet or issues otherwise. She is feeling better after getting angioedema cocktail.  Patient otherwise denies: - odynophagia - changes in voice, shortness of breath, hemoptysis Not on any ACE-I/ARB. There was some concern for IgG4 disease prior but decided SMG biopsy was unlikely to show this. Has also been evaluated by Dr. Noemi ([facility]) who felt this was assoc with underlying MCTD/RA. C3/4 normal, and C1 esterase thought unlikely and recommended RTC PRN  --------------------------------------------------------- 12/28/2023 Update; NAEON. Patient reports breathing is much better and swallowing back to baseline. She had pancakes this morning without issue.    Past Medical History:  Diagnosis Date   Autoimmune disorder    pleurisy   Mixed connective tissue disease    Osteoarthritis    Osteoporosis    Rheumatoid arthritis (HCC)     Past Surgical History:  Procedure Laterality Date   TUBAL LIGATION     1983    Family History  Problem Relation Age of Onset   Osteoporosis Mother    Deep vein thrombosis Mother    Alzheimer's disease Mother        Lewey body   Hypertension Mother    Hyperlipidemia Mother    Hypertension Father    Osteoporosis Daughter        osteopenia   Breast cancer Maternal Grandmother        in 37's   Colon cancer Paternal Grandfather 75    Social History:  reports that she has never smoked.  She has never used smokeless tobacco. She reports that she does not drink alcohol and does not use drugs.  Allergies: Allergies[1]  Medications: I have reviewed the patient's current medications.  Results for orders placed or performed during the hospital encounter of 12/27/23 (from the past 48 hours)  Group A Strep by PCR     Status: None   Collection Time: 12/27/23  8:16 AM   Specimen: Throat; Sterile Swab  Result Value Ref Range   Group A Strep by PCR NOT DETECTED NOT DETECTED    Comment: Performed at [facility], [address], [place], [place] [address]  Resp panel by RT-PCR (RSV, Flu A&B, Covid) Anterior Nasal Swab     Status: None   Collection Time: 12/27/23  8:16 AM   Specimen: Anterior Nasal Swab  Result Value Ref Range   SARS Coronavirus 2 by RT PCR NEGATIVE NEGATIVE    Comment: (NOTE) SARS-CoV-2 target nucleic acids are NOT DETECTED.  The SARS-CoV-2 RNA is generally detectable in upper respiratory specimens during the acute phase of infection. The lowest concentration of SARS-CoV-2 viral copies this assay can detect is 138 copies/mL. A negative result does not preclude SARS-Cov-2 infection and should not be used as the sole basis for treatment or other patient management decisions. A negative result may occur with  improper specimen collection/handling, submission of specimen other than nasopharyngeal swab, presence of viral mutation(s) within the areas targeted  by this assay, and inadequate number of viral copies(<138 copies/mL). A negative result must be combined with clinical observations, patient history, and epidemiological information. The expected result is Negative.  Fact Sheet for Patients:  bloggercourse.com  Fact Sheet for Healthcare Providers:  seriousbroker.it  This test is no t yet approved or cleared by the [place]  FDA and  has been authorized for detection and/or diagnosis  of SARS-CoV-2 by FDA under an Emergency Use Authorization (EUA). This EUA will remain  in effect (meaning this test can be used) for the duration of the COVID-19 declaration under Section 564(b)(1) of the Act, 21 U.S.C.section 360bbb-3(b)(1), unless the authorization is terminated  or revoked sooner.       Influenza A by PCR NEGATIVE NEGATIVE   Influenza B by PCR NEGATIVE NEGATIVE    Comment: (NOTE) The Xpert Xpress SARS-CoV-2/FLU/RSV plus assay is intended as an aid in the diagnosis of influenza from Nasopharyngeal swab specimens and should not be used as a sole basis for treatment. Nasal washings and aspirates are unacceptable for Xpert Xpress SARS-CoV-2/FLU/RSV testing.  Fact Sheet for Patients: bloggercourse.com  Fact Sheet for Healthcare Providers: seriousbroker.it  This test is not yet approved or cleared by the [place]  FDA and has been authorized for detection and/or diagnosis of SARS-CoV-2 by FDA under an Emergency Use Authorization (EUA). This EUA will remain in effect (meaning this test can be used) for the duration of the COVID-19 declaration under Section 564(b)(1) of the Act, 21 U.S.C. section 360bbb-3(b)(1), unless the authorization is terminated or revoked.     Resp Syncytial Virus by PCR NEGATIVE NEGATIVE    Comment: (NOTE) Fact Sheet for Patients: bloggercourse.com  Fact Sheet for Healthcare Providers: seriousbroker.it  This test is not yet approved or cleared by the [place]  FDA and has been authorized for detection and/or diagnosis of SARS-CoV-2 by FDA under an Emergency Use Authorization (EUA). This EUA will remain in effect (meaning this test can be used) for the duration of the COVID-19 declaration under Section 564(b)(1) of the Act, 21 U.S.C. section 360bbb-3(b)(1), unless the authorization is terminated or revoked.  Performed at [facility], [address], [place], [place] [address]   CBC with Differential     Status: None   Collection Time: 12/27/23  8:48 AM  Result Value Ref Range   WBC 5.5 4.0 - 10.5 K/uL   RBC 4.18 3.87 - 5.11 MIL/uL   Hemoglobin 13.8 12.0 - 15.0 g/dL   HCT 59.1 63.9 - 53.9 %   MCV 97.6 80.0 - 100.0 fL   MCH 33.0 26.0 - 34.0 pg   MCHC 33.8 30.0 - 36.0 g/dL   RDW 85.6 88.4 - 84.4 %   Platelets 242 150 - 400 K/uL   nRBC 0.0 0.0 - 0.2 %   Neutrophils Relative % 80 %   Neutro Abs 4.4 1.7 - 7.7 K/uL   Lymphocytes Relative 13 %   Lymphs Abs 0.7 0.7 - 4.0 K/uL   Monocytes Relative 6 %   Monocytes Absolute 0.4 0.1 - 1.0 K/uL   Eosinophils Relative 0 %   Eosinophils Absolute 0.0 0.0 - 0.5 K/uL   Basophils Relative 0 %   Basophils Absolute 0.0 0.0 - 0.1 K/uL   Immature Granulocytes 1 %   Abs Immature Granulocytes 0.03 0.00 - 0.07 K/uL    Comment: Performed at [facility], [address] [place], [place] [address]  Basic metabolic panel     Status: Abnormal   Collection  Time: 12/27/23  8:48 AM  Result Value Ref Range   Sodium 139 135 - 145 mmol/L   Potassium 4.0 3.5 - 5.1 mmol/L   Chloride 101 98 - 111 mmol/L   CO2 27 22 - 32 mmol/L   Glucose, Bld 143 (H) 70 - 99 mg/dL    Comment: Glucose reference range applies only to samples taken after fasting for at least 8 hours.   BUN 19 8 - 23 mg/dL   Creatinine, Ser 9.25 0.44 - 1.00 mg/dL   Calcium 9.2 8.9 - 89.6 mg/dL   GFR, Estimated >39 >39 mL/min    Comment: (NOTE) Calculated using the CKD-EPI Creatinine Equation (2021)    Anion gap 11 5 - 15    Comment: Performed at [facility], [address], [place], [place] [address]  HIV Antibody (routine testing w rflx)     Status: None   Collection Time: 12/28/23  2:37 AM  Result Value Ref Range   HIV Screen 4th Generation wRfx Non Reactive Non Reactive    Comment: Performed at [facility], [address] [place], [place]  [address]  CBC     Status: Abnormal   Collection Time: 12/28/23  2:37 AM  Result Value Ref Range   WBC 4.6 4.0 - 10.5 K/uL   RBC 3.90 3.87 - 5.11 MIL/uL   Hemoglobin 12.8 12.0 - 15.0 g/dL   HCT 61.5 63.9 - 53.9 %   MCV 98.5 80.0 - 100.0 fL   MCH 32.8 26.0 - 34.0 pg   MCHC 33.3 30.0 - 36.0 g/dL   RDW 85.3 88.4 - 84.4 %   Platelets 241 150 - 400 K/uL   nRBC 0.4 (H) 0.0 - 0.2 %    Comment: Performed at [facility], [address], [place], [place] [address]  Basic metabolic panel     Status: Abnormal   Collection Time: 12/28/23  2:37 AM  Result Value Ref Range   Sodium 141 135 - 145 mmol/L   Potassium 4.6 3.5 - 5.1 mmol/L   Chloride 105 98 - 111 mmol/L   CO2 27 22 - 32 mmol/L   Glucose, Bld 142 (H) 70 - 99 mg/dL    Comment: Glucose reference range applies only to samples taken after fasting for at least 8 hours.   BUN 18 8 - 23 mg/dL   Creatinine, Ser 9.31 0.44 - 1.00 mg/dL   Calcium 8.8 (L) 8.9 - 10.3 mg/dL   GFR, Estimated >39 >39 mL/min    Comment: (NOTE) Calculated using the CKD-EPI Creatinine Equation (2021)    Anion gap 9 5 - 15    Comment: Performed at [facility], [address], [place], [place] [address]    No results found.   Blood pressure (!) 115/52, pulse 66, temperature (!) 97.5 F (36.4 C), temperature source Oral, resp. rate 19, height 5' 3.5 (1.613 m), weight 51.8 kg, SpO2 100%.  PHYSICAL EXAM: CONSTITUTIONAL: well developed, nourished, no distress and alert and oriented x 3 CARDIOVASCULAR: normal rate PULMONARY/CHEST WALL: effort normal and no stridor, no stertor, no dysphonia; easily lays flat and tolerates secretions HENT: Head : normocephalic and atraumatic Ears: Right ear:   canal normal, external ear normal and hearing normal Left ear:   canal normal, external ear normal and hearing normal Nose: nose normal and no purulence Mouth/Throat:  Mouth: uvula midline, resolution of edema; oral cavity swelling appreciated Throat: oropharynx  clear and moist Mucous membranes: normal EYES: conjunctiva  normal, EOM normal and PERRL NECK: supple, trachea normal and no thyromegaly or cervical LAD   Assessment/Plan: 68 y.o. with:  Angioedema Unclear trigger, prior has had eval for this and improved with angioedema cocktail. Exam today shows some pyriform, mild AE fullness but endolarynx and epiglottis and tongue base without edema. Airway is overall patent and not in any distress but given TFL, admitted for observation.  Significantly improved/back to baseline after angioedema cocktail As such, d/w pt and she is stable for discharge from ENT standpoint on medrol  dose pack and augmentin  BID x7d Return precautions discussed. Does not need formal follow up with ENT given prior workup unless has issues   12/28/2023, 8:34 AM   MDM: low    [1]  Allergies Allergen Reactions   Azathioprine Itching   Leflunomide Itching   Sulfa Antibiotics     Unsure of reaction   Sulfamethoxazole Rash    Pt does not remember

## 2023-12-28 NOTE — Plan of Care (Signed)

## 2023-12-29 ENCOUNTER — Telehealth: Payer: Self-pay

## 2023-12-29 NOTE — Transitions of Care (Post Inpatient/ED Visit) (Signed)
" ° °  12/29/2023  Name: MALACHI SUDERMAN MRN: [id number] DOB: July 29, 1955  Today's TOC FU Call Status: Today's TOC FU Call Status:: Unsuccessful Call (1st Attempt) Unsuccessful Call (1st Attempt) Date: 12/29/23  Attempted to reach the patient regarding the most recent Inpatient/ED visit.  Follow Up Plan: Additional outreach attempts will be made to reach the patient to complete the Transitions of Care (Post Inpatient/ED visit) call.   Signature Julian Lemmings, LPN [facility] Nurse Health Advisor Direct Dial [phone]  "

## 2024-01-04 NOTE — Transitions of Care (Post Inpatient/ED Visit) (Unsigned)
" ° °  01/04/2024  Name: Patricia Oconnell MRN: [id number] DOB: [birth year]  Today's TOC FU Call Status: Today's TOC FU Call Status:: Unsuccessful Call (2nd Attempt) Unsuccessful Call (1st Attempt) Date: 12/29/23 Unsuccessful Call (2nd Attempt) Date: 01/04/24  Attempted to reach the patient regarding the most recent Inpatient/ED visit.  Follow Up Plan: Additional outreach attempts will be made to reach the patient to complete the Transitions of Care (Post Inpatient/ED visit) call.   Signature Julian Lemmings, LPN [facility] Nurse Health Advisor Direct Dial [phone]  "

## 2024-01-05 NOTE — Transitions of Care (Post Inpatient/ED Visit) (Signed)
" ° °  01/05/2024  Name: Patricia Oconnell MRN: [id number] DOB: 05-26-1955  Today's TOC FU Call Status: Today's TOC FU Call Status:: Unsuccessful Call (3rd Attempt) Unsuccessful Call (1st Attempt) Date: 12/29/23 Unsuccessful Call (2nd Attempt) Date: 01/04/24 Unsuccessful Call (3rd Attempt) Date: 01/05/24  Attempted to reach the patient regarding the most recent Inpatient/ED visit.  Follow Up Plan: No further outreach attempts will be made at this time. We have been unable to contact the patient.  Signature Julian Lemmings, LPN [facility] Nurse Health Advisor Direct Dial [phone]  "

## 2024-03-08 ENCOUNTER — Encounter: Admitting: Family Medicine
